# Patient Record
Sex: Female | Born: 1965 | Race: White | Hispanic: No | Marital: Married | Smoking: Never smoker
Health system: Southern US, Community
[De-identification: ages and names within clinical notes are randomized; demographics above are authoritative.]

## PROBLEM LIST (undated history)

## (undated) DIAGNOSIS — L409 Psoriasis, unspecified: Secondary | ICD-10-CM

## (undated) DIAGNOSIS — G43829 Menstrual migraine, not intractable, without status migrainosus: Secondary | ICD-10-CM

## (undated) DIAGNOSIS — T7840XA Allergy, unspecified, initial encounter: Secondary | ICD-10-CM

## (undated) DIAGNOSIS — Z975 Presence of (intrauterine) contraceptive device: Secondary | ICD-10-CM

## (undated) DIAGNOSIS — K219 Gastro-esophageal reflux disease without esophagitis: Secondary | ICD-10-CM

## (undated) HISTORY — DX: Allergy, unspecified, initial encounter: T78.40XA

## (undated) HISTORY — PX: NASAL SEPTUM SURGERY: SHX37

## (undated) HISTORY — DX: Menstrual migraine, not intractable, without status migrainosus: G43.829

## (undated) HISTORY — DX: Presence of (intrauterine) contraceptive device: Z97.5

## (undated) HISTORY — DX: Gastro-esophageal reflux disease without esophagitis: K21.9

## (undated) HISTORY — DX: Psoriasis, unspecified: L40.9

---

## 2009-10-19 LAB — HM COLONOSCOPY: HM Colonoscopy: 10

## 2011-11-18 LAB — HM PAP SMEAR

## 2011-11-23 ENCOUNTER — Ambulatory Visit (INDEPENDENT_AMBULATORY_CARE_PROVIDER_SITE_OTHER): Payer: BC Managed Care – PPO | Admitting: Family Medicine

## 2011-11-23 ENCOUNTER — Encounter: Payer: Self-pay | Admitting: Family Medicine

## 2011-11-23 DIAGNOSIS — G43829 Menstrual migraine, not intractable, without status migrainosus: Secondary | ICD-10-CM

## 2011-11-23 DIAGNOSIS — J309 Allergic rhinitis, unspecified: Secondary | ICD-10-CM

## 2011-11-23 DIAGNOSIS — J4530 Mild persistent asthma, uncomplicated: Secondary | ICD-10-CM

## 2011-11-23 DIAGNOSIS — J45909 Unspecified asthma, uncomplicated: Secondary | ICD-10-CM

## 2011-11-23 DIAGNOSIS — J069 Acute upper respiratory infection, unspecified: Secondary | ICD-10-CM

## 2011-11-23 MED ORDER — FLUTICASONE-SALMETEROL 100-50 MCG/DOSE IN AEPB: 1.0000 | INHALATION_SPRAY | Freq: Two times a day (BID) | RESPIRATORY_TRACT | Status: AC

## 2011-11-23 MED ORDER — ALBUTEROL SULFATE HFA 108 (90 BASE) MCG/ACT IN AERS: 2.0000 | INHALATION_SPRAY | Freq: Four times a day (QID) | RESPIRATORY_TRACT | Status: DC | PRN

## 2011-11-23 NOTE — Patient Instructions (Signed)
Start back on the nasonex and astepro.  Use the advair twice a day, rinse after use, and use the albuterol as needed.  Let me know if you aren't improving.   When you are feeling better, I would look into getting a TDAP.   Take care.  Glad to see you.

## 2011-11-24 ENCOUNTER — Encounter: Payer: Self-pay | Admitting: Family Medicine

## 2011-11-24 DIAGNOSIS — J309 Allergic rhinitis, unspecified: Secondary | ICD-10-CM | POA: Insufficient documentation

## 2011-11-24 DIAGNOSIS — G43829 Menstrual migraine, not intractable, without status migrainosus: Secondary | ICD-10-CM | POA: Insufficient documentation

## 2011-11-24 DIAGNOSIS — J069 Acute upper respiratory infection, unspecified: Secondary | ICD-10-CM | POA: Insufficient documentation

## 2011-11-24 DIAGNOSIS — J4530 Mild persistent asthma, uncomplicated: Secondary | ICD-10-CM | POA: Insufficient documentation

## 2011-11-24 NOTE — Assessment & Plan Note (Signed)
Off usual meds, will restart.  This may be related to home allergen source and this is being evaluated.  

## 2011-11-24 NOTE — Assessment & Plan Note (Signed)
Will d/w OB clinic.

## 2011-11-24 NOTE — Progress Notes (Signed)
New patient to est care.  Requesting records.   ASTHMA Symptoms are well controlled: not after move into new house, possible new environmental trigger Using medications without problems: off meds Night time symptoms: occ Wheeze/SOB:yes ER visits since last visit:no Missed work or school:no Allergens:  See above   Migraine, menstrual, no aura.  Better when breastfeeding and no menses.  Pt to check with OB clinic about options, ie IUD.  D/w pt today.   duration of symptoms: several days, recently on abx for prev episode Rhinorrhea:yes Congestion:yes ear pain:no sore throat: occ Cough: occ myalgias:no  ROS: See HPI.  Otherwise negative.    Meds, vitals, and allergies reviewed.   GEN: nad, alert and oriented HEENT: mucous membranes moist, TM w/o erythema, nasal epithelium injected, OP with cobblestoning NECK: supple w/o LA CV: rrr. PULM: ctab, no inc wob ABD: soft, +bs EXT: no edema

## 2011-11-24 NOTE — Assessment & Plan Note (Signed)
Likely viral as child is in daycare.  Nontoxic, supportive tx.

## 2011-11-24 NOTE — Assessment & Plan Note (Signed)
Off usual meds, will restart.  This may be related to home allergen source and this is being evaluated.

## 2011-12-15 ENCOUNTER — Ambulatory Visit (INDEPENDENT_AMBULATORY_CARE_PROVIDER_SITE_OTHER): Payer: BC Managed Care – PPO | Admitting: Family Medicine

## 2011-12-15 ENCOUNTER — Encounter: Payer: Self-pay | Admitting: Family Medicine

## 2011-12-15 ENCOUNTER — Telehealth: Payer: Self-pay | Admitting: Family Medicine

## 2011-12-15 VITALS — BP 120/78 | HR 76 | Temp 98.3°F | Ht 60.0 in | Wt 122.8 lb

## 2011-12-15 DIAGNOSIS — D229 Melanocytic nevi, unspecified: Secondary | ICD-10-CM

## 2011-12-15 DIAGNOSIS — N390 Urinary tract infection, site not specified: Secondary | ICD-10-CM | POA: Insufficient documentation

## 2011-12-15 DIAGNOSIS — R350 Frequency of micturition: Secondary | ICD-10-CM

## 2011-12-15 DIAGNOSIS — D239 Other benign neoplasm of skin, unspecified: Secondary | ICD-10-CM

## 2011-12-15 LAB — POCT URINALYSIS DIPSTICK
Glucose, UA: NEGATIVE
Nitrite, UA: NEGATIVE
Urobilinogen, UA: 0.2

## 2011-12-15 LAB — POCT UA - MICROSCOPIC ONLY

## 2011-12-15 MED ORDER — CEPHALEXIN 250 MG PO CAPS: 250.0000 mg | ORAL_CAPSULE | Freq: Two times a day (BID) | ORAL | Status: AC

## 2011-12-15 NOTE — Progress Notes (Signed)
Subjective:    Patient ID: Latoya Berry, female    DOB: February 19, 1966, 46 y.o.   MRN: [id number]  HPI Thinks she has a uti and also 2 spots on her anterior chest -- seem wart like   Have been there for 10 days  Has never had anything like that before  They are on top of moles that were there before  They do feel a bit irritated   Urine symptoms started on Monday  Low back pain - more on the R than the left Drinks one soda per day  Drank lemonade one day - and it made it worse  No hx of kidney stone  Is having mild burning when she urinates  Feels like she has to go all the time - and some urgency  No blood in urine that she can see No fever or n/v   Patient Active Problem List  Diagnoses  . Migraine, menstrual  . URI (upper respiratory infection)  . Asthma, mild persistent  . Allergic rhinitis  . UTI (lower urinary tract infection)  . Multiple nevi   Past Medical History  Diagnosis Date  . Asthma   . Menstrual migraine   . Psoriasis   . Allergy   . GERD (gastroesophageal reflux disease)    Past Surgical History  Procedure Date  . Nasal septum surgery    History  Substance Use Topics  . Smoking status: Never Smoker   . Smokeless tobacco: Never Used  . Alcohol Use: No   Family History  Problem Relation Age of Onset  . Hypertension Father    Allergies  Allergen Reactions  . Erythromycin Diarrhea  . Triptans Other (See Comments)    Out of body experience, can't tolerated maxalt **can tolerate imitrex, frova, relpax   Current Outpatient Prescriptions on File Prior to Visit  Medication Sig Dispense Refill  . Prenatal Vit-Fe Fumarate-FA (MULTIVITAMIN-PRENATAL) 27-0.8 MG TABS Take 1 tablet by mouth daily.      Marland Kitchen albuterol (PROVENTIL HFA;VENTOLIN HFA) 108 (90 BASE) MCG/ACT inhaler Inhale 2 puffs into the lungs every 6 (six) hours as needed for wheezing.  1 Inhaler  3  . azelastine (ASTELIN) 137 MCG/SPRAY nasal spray Place 1 spray into the nose 2 (two) times  daily. Use in each nostril as directed      . Fluticasone-Salmeterol (ADVAIR) 100-50 MCG/DOSE AEPB Inhale 1 puff into the lungs 2 (two) times daily.  1 each  3  . mometasone (NASONEX) 50 MCG/ACT nasal spray Place 2 sprays into the nose daily.         Review of Systems Review of Systems  Constitutional: Negative for fever, appetite change, fatigue and unexpected weight change.  Eyes: Negative for pain and visual disturbance.  Respiratory: Negative for cough and shortness of breath.   Cardiovascular: Negative for cp or palpitations    Gastrointestinal: Negative for nausea, diarrhea and constipation.  Genitourinary: pos for urgency/ frequency/ mild dysuria and flank pain  Skin: Negative for pallor or rash  pos for lesions on chest Neurological: Negative for weakness, light-headedness, numbness and headaches.  Hematological: Negative for adenopathy. Does not bruise/bleed easily.  Psychiatric/Behavioral: Negative for dysphoric mood. The patient is not nervous/anxious.          Objective:   Physical Exam  Constitutional: She appears well-developed and well-nourished. No distress.  HENT:  Head: Normocephalic and atraumatic.  Mouth/Throat: Oropharynx is clear and moist.  Eyes: Conjunctivae and EOM are normal. Pupils are equal, round, and reactive to light.  No scleral icterus.  Neck: Neck supple.  Cardiovascular: Normal rate, regular rhythm, normal heart sounds and intact distal pulses.   Pulmonary/Chest: Effort normal and breath sounds normal. No respiratory distress. She has no wheezes.  Abdominal: Soft. Bowel sounds are normal. She exhibits no distension. There is tenderness. There is no rebound and no guarding.       Mild suprapubic tenderness without rebound or gaurding   Very mild tenderness in R flank area as well - but not cva   Musculoskeletal: Normal range of motion. She exhibits no edema.  Lymphadenopathy:    She has no cervical adenopathy.  Neurological: She is alert. She has  normal reflexes.  Skin: Skin is warm and dry. No rash noted.       Nevus on chest L of midline- raised and fleshy- with very dry irritated appearance , tan in color 3 mm  Smaller nevus on R anterior chest 1-2 mm that is tag like also with dry texture   Psychiatric: She has a normal mood and affect.          Assessment & Plan:

## 2011-12-15 NOTE — Assessment & Plan Note (Signed)
Pt has 2 raised nevi on chest that are benign appearing but very dry/ irritated  I suspect these have become dry and irritated from friction/clothing - adv her to try her topicort daily for about 2 weeks and then if not improved to alert [place] (in that case would remove them)

## 2011-12-15 NOTE — Assessment & Plan Note (Signed)
Pt having urinary frequency with back pain - and bacteria on ua  Treated with keflex (since she is nursing) and inst to drink lots of water Handout given on uti  Update if not starting to improve in a week or if worsening

## 2011-12-15 NOTE — Telephone Encounter (Signed)
To dr. Algis Downs, i have never seen pt.

## 2011-12-15 NOTE — Telephone Encounter (Signed)
Triage Record Num: [id number] Operator: Chevis Pretty Patient Name: Latoya Berry Call Date & Time: 12/15/2011 10:47:34AM Patient Phone: [phone] PCP: Patient Gender: Female PCP Fax : Patient DOB: 12-Mar-1966 Practice Name: [facility] Reason for Call: Caller: Latoya Berry/Patient; PCP: Juleen China.; CB#: [phone]; ; ; Call regarding Knot On Her Chest; states is a new patient and has been in office x 1. States has two "strange wart type lumps" in chest area above the sternal notch. States she also has urinary symptoms. Both issues have come up in past week. Lumps are white and do not appear infected. Afebrile. States having urinary frequency, urgency and low back pain. Per protocols, advised appt within 4 hours; declines appt within 4 hours, as she is working and cannot make 1215 appt. Appt sched in office 1615 12/15/11. Protocol(s) Used: Skin Lesions Recommended Outcome per Protocol: See Provider within 24 hours Reason for Outcome: Clusters of swollen, tender, red nodules not improved after 7 days of home treatment Care Advice: ~ Wash the affected area 2-3 times a day with a mild soap. If lesion begins to drain, cover with an absorbent dressing and change dressing 2-3 times a day or when soiled. Dispose of old dressings in a plastic bag. Wash hands thoroughly. ~ During pregnancy, call provider if temperature is 100 F (37.7 C) or greater OR any temperature elevation for 3 days even while taking acetaminophen. ~ To help evaluate changing size, mark lesion area with a pen. If marked area increases more than one inch in an hour, call provider immediately. ~ If lesion is draining, do not re-use or share washcloths or towels. Clothing, linens or other items that have had contact with infected area should be washed in very hot water. ~ Do not try to open a lesion with pressure, squeezing or poking with a needle. This may cause or worsen an infection and increase risk of  scarring. ~ ~ SYMPTOM / CONDITION MANAGEMENT ~ INFECTION CONTROL ~ CAUTIONS Analgesic/Antipyretic Advice - Acetaminophen: Consider acetaminophen as directed on label or by pharmacist/provider for pain or fever PRECAUTIONS: - Use if there is no history of liver disease, alcoholism, or intake of three or more alcohol drinks per day - Only if approved by provider during pregnancy or when breastfeeding - During pregnancy, acetaminophen should not be taken more than 3 consecutive days without telling provider - Do not exceed recommended dose or frequency ~ See a provider within 4 hours if area is quickly getting larger or more areas develop, becoming more painful, purulent drainage, new fever or not improving with treatment. ~ Analgesic/Antipyretic Advice - NSAIDs: Consider aspirin, ibuprofen, naproxen or ketoprofen for pain or fever as directed on label or by pharmacist/provider. PRECAUTIONS: - If over 89 years of age, should not take longer than 1 week without consulting provider. EXCEPTIONS: - Should not be used if taking blood thinners or have bleeding problems. - Do not use if have history of sensitivity/allergy to any of these medications; or history of cardiovascular, ulcer, ~ 12/15/2011 10:58:35AM Page 1 of 2 CAN_TriageRpt_V2 [facility] Triage Call Report Patient Name: Latoya Berry continuation page/s kidney, liver disease or diabetes unless approved by provider. - Do not exceed recommended dose or frequency. ~ Apply warm, moist compress to area for 20-30 min., every 2-3 hours., during waking hours. 12/15/2011 10:58:35AM Page 2 of 2 CAN_TriageRpt_V2 [facility] Triage Call Report Triage Record Num: [id number] Operator: Chevis Pretty Patient Name: Latoya Berry Call Date & Time: 12/15/2011 10:47:34AM Patient Phone: [phone] PCP:  Patient Gender: Female PCP Fax : Patient DOB: 10-Sep-1966 Practice Name: [facility] Reason for Call: Caller: Latoya Berry/Patient;  PCP: Juleen China.; CB#: [phone]; ; ; Call regarding Knot On Her Chest; states is a new patient and has been in office x 1. States has two "strange wart type lumps" in chest area above the sternal notch. States she also has urinary symptoms. Both issues have come up in past week. Lumps are white and do not appear infected. Afebrile. States having urinary frequency, urgency and low back pain. Per protocols, advised appt within 4 hours; declines appt within 4 hours, as she is working and cannot make 1215 appt. Appt sched in office 1615 12/15/11. Protocol(s) Used: Urinary Symptoms - Female Recommended Outcome per Protocol: See Provider within 4 hours Reason for Outcome: Urinary tract symptoms AND any flank or low back pain Care Advice: ~ Another adult should drive. ~ Tell provider medical history of renal disease; especially if have only one kidney. ~ Call provider if symptoms worsen, such as increasing pain in low back, pelvis, or side(s); blood in urine; or fever. Increase intake of fluids. Try to drink 8 oz. (.2 liter) every hour when awake, including unsweetened cranberry juice, unless on restricted fluids for other medical reasons. Take sips of fluid or eat ice chips if nauseated or vomiting. ~ ~ Tell your provider if you are taking Warfarin (Coumadin) and drinking cranberry juice or taking cranberry capsules. ~ SYMPTOM / CONDITION MANAGEMENT ~ CAUTIONS ~ List, or take, all current prescription(s), nonprescription or alternative medication(s) to provider for evaluation. Limit carbonated, alcoholic, and caffeinated beverages such as coffee, tea and soda. Avoid nonprescription cold and allergy medications that contain caffeine. Limit intake of tomatoes, fruit juices (except for unsweetened cranberry juice), dairy products, spicy foods, sugar, and artificial sweeteners (aspartame or saccharine). Stop or decrease smoking. Reducing exposure to bladder irritants may help lessen  urgency. ~ Analgesic/Antipyretic Advice - Acetaminophen: Consider acetaminophen as directed on label or by pharmacist/provider for pain or fever PRECAUTIONS: - Use if there is no history of liver disease, alcoholism, or intake of three or more alcohol drinks per day - Only if approved by provider during pregnancy or when breastfeeding - During pregnancy, acetaminophen should not be taken more than 3 consecutive days without telling provider - Do not exceed recommended dose or frequency ~ Systemic Inflammatory Response Syndrome (SIRS): Watch for signs of a generalized, whole body infection. Occurs within days of a localized infection, especially of the urinary, GI, respiratory or nervous systems; or after a traumatic injury or invasive procedure. - Call EMS 911 if symptoms have worsened, such as increasing confusion or unusual drowsiness; cold and clammy skin; no urine output; rapid respiration (>30/min.) or slow respiration (<10/min.); struggling to breathe. - Go to the ED immediately for early symptoms of rapid pulse >90/min. or rapid breathing >20/min. at rest; chills; oral temperature >100.4 F (38 C) or <96.8 F (36 C) when associated with conditions noted. ~ Analgesic/Antipyretic Advice - NSAIDs: Consider aspirin, ibuprofen, naproxen or ketoprofen for pain or fever as directed on label or by pharmacist/provider. ~ 12/15/2011 10:58:37AM Page 1 of 2 CAN_TriageRpt_V2 [facility] Triage Call Report Patient Name: Latoya Berry continuation page/s PRECAUTIONS: - If over 41 years of age, should not take longer than 1 week without consulting provider. EXCEPTIONS: - Should not be used if taking blood thinners or have bleeding problems. - Do not use if have history of sensitivity/allergy to any of these medications; or history of cardiovascular, ulcer,  kidney, liver disease or diabetes unless approved by provider. - Do not exceed recommended dose or frequency. 12/15/2011 10:58:37AM Page 2  of 2 CAN_TriageRpt_V2

## 2011-12-15 NOTE — Telephone Encounter (Signed)
Scheduled with [facility] today.

## 2011-12-15 NOTE — Patient Instructions (Addendum)
Drink lots of water  Take the keflex as directed  For moles on your chest - use your topicort -- daily for about 2 weeks  If they remain problematic - let us know

## 2011-12-18 ENCOUNTER — Encounter: Payer: Self-pay | Admitting: Family Medicine

## 2012-11-08 ENCOUNTER — Emergency Department: Payer: Self-pay | Admitting: Emergency Medicine

## 2012-11-08 ENCOUNTER — Telehealth: Payer: Self-pay

## 2012-11-08 LAB — CBC
MCH: 30.1 pg (ref 26.0–34.0)
MCHC: 34 g/dL (ref 32.0–36.0)
MCV: 89 fL (ref 80–100)
Platelet: 242 10*3/uL (ref 150–440)
RDW: 13.1 % (ref 11.5–14.5)
WBC: 10.5 10*3/uL (ref 3.6–11.0)

## 2012-11-08 LAB — BASIC METABOLIC PANEL
BUN: 14 mg/dL (ref 7–18)
Calcium, Total: 8.8 mg/dL (ref 8.5–10.1)
Co2: 27 mmol/L (ref 21–32)
EGFR (African American): >60
EGFR (Non-African Amer.): >60
Glucose: 83 mg/dL (ref 65–99)
Osmolality: 281 (ref 275–301)
Potassium: 3.5 mmol/L (ref 3.5–5.1)

## 2012-11-08 LAB — CK TOTAL AND CKMB (NOT AT ARMC)
CK, Total: 66 U/L (ref 21–215)
CK-MB: <0.5 ng/mL — ABNORMAL LOW (ref 0.5–3.6)

## 2012-11-08 NOTE — Telephone Encounter (Addendum)
Pt walked in with upper mid and lt dull chest pain or soreness; when takes deep breath pain gets sharp;started last night. No arm or neck pain and no N or V. Pt is not dizzy. Pt does not feel like heart rate is up.pt does not have SOB unless takes deep breath. Pt only takes multivitamin now on no regular meds. No available appt; spoke with Carlena Sax RN lead and Dr Lianne Bushy CMA and pt to go to [facility] for evaluation. Pt does not appear in any distress but offered to call 911. Pt said no she was OK to drive and will go to [facility] on [address] in [place] now. Pt will update with condition and will call to schedule appt with Dr Para March at later time. Pts pain level now is 3.

## 2012-11-08 NOTE — Telephone Encounter (Signed)
Noted  

## 2012-12-11 ENCOUNTER — Encounter: Payer: Self-pay | Admitting: Family Medicine

## 2012-12-11 ENCOUNTER — Ambulatory Visit (INDEPENDENT_AMBULATORY_CARE_PROVIDER_SITE_OTHER): Payer: BC Managed Care – PPO | Admitting: Family Medicine

## 2012-12-11 VITALS — BP 102/70 | HR 72 | Temp 98.0°F | Wt 125.0 lb

## 2012-12-11 DIAGNOSIS — M7710 Lateral epicondylitis, unspecified elbow: Secondary | ICD-10-CM | POA: Insufficient documentation

## 2012-12-11 NOTE — Assessment & Plan Note (Signed)
Anatomy d/w pt.  Would ice, brace and use rehab exercises per handout.  She agrees.  Fu prn.  No need to image. Will defer macule to derm.

## 2012-12-11 NOTE — Patient Instructions (Addendum)
Get a tennis elbow strap, ice your elbow, and work through the exercises.

## 2012-12-11 NOTE — Progress Notes (Signed)
R lateral elbow pain.  Isn't getting better.  Pain with ROM, esp supination.  Going on for about 1 month.  Pain is getting worse.  Was bumped the elbow prev when bathing her daughter.    She has a nevus/hyperpigmented macule on the elbow but I told her I would defer to derm on this.    Meds, vitals, and allergies reviewed.   ROS: See HPI.  Otherwise, noncontributory.  nad ROM wnl at the R shoulder/elbow/wrist but pain with supination at the elbow.  Elbow with normal inspection.  pain with supination at the elbow resolved with compression of the lateral/proximal forearm Distally nv intact

## 2013-07-29 ENCOUNTER — Encounter: Payer: Self-pay | Admitting: Internal Medicine

## 2013-07-29 ENCOUNTER — Ambulatory Visit (INDEPENDENT_AMBULATORY_CARE_PROVIDER_SITE_OTHER): Payer: BC Managed Care – PPO | Admitting: Internal Medicine

## 2013-07-29 VITALS — BP 124/78 | HR 64 | Temp 98.0°F | Wt 122.5 lb

## 2013-07-29 DIAGNOSIS — J069 Acute upper respiratory infection, unspecified: Secondary | ICD-10-CM

## 2013-07-29 MED ORDER — AZITHROMYCIN 250 MG PO TABS: ORAL_TABLET | ORAL | Status: AC

## 2013-07-29 NOTE — Patient Instructions (Signed)

## 2013-07-29 NOTE — Progress Notes (Signed)
HPI  Pt presents to the clinic today with c/o cold symptoms x 5 days. The worst part is the sore throat and dry cough. She does produce thick green sputum. She denies fever, chills or body aches. She has tried Robitussin, Mucinex, cough drops and nothing seems to help. The cough is worse at night. She has not had much sleep in 3 nights. She does have a history of allergies but no asthma. She does have sick contacts.  Review of Systems      Past Medical History  Diagnosis Date  . Asthma   . Menstrual migraine   . Psoriasis   . Allergy   . GERD (gastroesophageal reflux disease)   . IUD (intrauterine device) in place     Family History  Problem Relation Age of Onset  . Hypertension Father     History   Social History  . Marital Status: Married    Spouse Name: N/A    Number of Children: N/A  . Years of Education: N/A   Occupational History  . Not on file.   Social History Main Topics  . Smoking status: Never Smoker   . Smokeless tobacco: Never Used  . Alcohol Use: No  . Drug Use: No  . Sexual Activity: Not on file   Other Topics Concern  . Not on file   Social History Narrative   Teaches spanish   Married 1997   1 daughter, Jaclynn Guarneri    Allergies  Allergen Reactions  . Erythromycin Diarrhea  . Triptans Other (See Comments)    Out of body experience, can't tolerated maxalt **can tolerate imitrex, frova, relpax     Constitutional: Positive headache, fatigue and fever. Denies abrupt weight changes.  HEENT:  Positive sore throat. Denies eye redness, eye pain, pressure behind the eyes, facial pain, nasal congestion, ear pain, ringing in the ears, wax buildup, runny nose or bloody nose. Respiratory: Positive cough. Denies difficulty breathing or shortness of breath.  Cardiovascular: Denies chest pain, chest tightness, palpitations or swelling in the hands or feet.   No other specific complaints in a complete review of systems (except as listed in HPI  above).  Objective:   BP 124/78  Pulse 64  Temp(Src) 98 F (36.7 C) (Oral)  Wt 122 lb 8 oz (55.566 kg)  BMI 23.92 kg/m2  SpO2 99%  LMP 12/03/2012 Wt Readings from Last 3 Encounters:  07/29/13 122 lb 8 oz (55.566 kg)  12/11/12 125 lb (56.7 kg)  12/15/11 122 lb 12 oz (55.679 kg)     General: Appears her stated age, well developed, well nourished in NAD. HEENT: Head: normal shape and size; Eyes: sclera white, no icterus, conjunctiva pink, PERRLA and EOMs intact; Ears: Tm's gray and intact, normal light reflex; Nose: mucosa pink and moist, septum midline; Throat/Mouth: + PND. Teeth present, mucosa erythematous and moist, no exudate noted, no lesions or ulcerations noted.  Neck: Mild cervical lymphadenopathy. Neck supple, trachea midline. No massses, lumps or thyromegaly present.  Cardiovascular: Normal rate and rhythm. S1,S2 noted.  No murmur, rubs or gallops noted. No JVD or BLE edema. No carotid bruits noted. Pulmonary/Chest: Normal effort and positive vesicular breath sounds. No respiratory distress. No wheezes, rales or ronchi noted.      Assessment & Plan:   Upper Respiratory Infection, new onset with additional workup required:  Get some rest and drink plenty of water Do salt water gargles for the sore throat eRx for Azithromax x 5 days   RTC as needed or  if symptoms persist.

## 2013-12-03 ENCOUNTER — Emergency Department: Payer: Self-pay | Admitting: Internal Medicine

## 2013-12-03 ENCOUNTER — Telehealth: Payer: Self-pay | Admitting: Family Medicine

## 2013-12-03 LAB — HEPATIC FUNCTION PANEL A (ARMC)
Albumin: 3.9 g/dL (ref 3.4–5.0)
Alkaline Phosphatase: 73 U/L
BILIRUBIN TOTAL: 0.3 mg/dL (ref 0.2–1.0)
Bilirubin, Direct: <0.1 mg/dL (ref 0.00–0.20)
SGOT(AST): 14 U/L — ABNORMAL LOW (ref 15–37)
SGPT (ALT): 17 U/L (ref 12–78)
TOTAL PROTEIN: 7.4 g/dL (ref 6.4–8.2)

## 2013-12-03 LAB — CBC
HCT: 41.6 % (ref 35.0–47.0)
HGB: 13.9 g/dL (ref 12.0–16.0)
MCH: 29.8 pg (ref 26.0–34.0)
MCHC: 33.4 g/dL (ref 32.0–36.0)
MCV: 89 fL (ref 80–100)
Platelet: 216 10*3/uL (ref 150–440)
RBC: 4.65 10*6/uL (ref 3.80–5.20)
RDW: 12.8 % (ref 11.5–14.5)
WBC: 10 10*3/uL (ref 3.6–11.0)

## 2013-12-03 LAB — BASIC METABOLIC PANEL
Anion Gap: 5 — ABNORMAL LOW (ref 7–16)
BUN: 17 mg/dL (ref 7–18)
CO2: 27 mmol/L (ref 21–32)
Calcium, Total: 8.6 mg/dL (ref 8.5–10.1)
Chloride: 106 mmol/L (ref 98–107)
Creatinine: 0.63 mg/dL (ref 0.60–1.30)
EGFR (Non-African Amer.): >60
Glucose: 83 mg/dL (ref 65–99)
Osmolality: 276 (ref 275–301)
POTASSIUM: 3.6 mmol/L (ref 3.5–5.1)
Sodium: 138 mmol/L (ref 136–145)

## 2013-12-03 LAB — TROPONIN I: Troponin-I: <0.02 ng/mL

## 2013-12-03 NOTE — Telephone Encounter (Signed)
Ebony Hail at the front desk answered a call from [facility] stating that pt had called their triage line with c/o chest pain.  They advised her to call 911 or go to ED per protocol.  She refused and said she was heading to our office to be seen.  When pt arrived at 4:35, she was not in distress.  She said she was having chest pain last night, it subsided this morning and then came back.  She said "I just need an EKG or something".  I informed her that she would need additional testing that we could not provide at our office.  I spoke with Dr. Damita Dunnings who advised if [facility] instructed her to be seen in ED, then that's where she needed to go.  When I went back to speak with pt, she was sitting in a chair playing with her iPad and talking on the phone.  I informed her that we follow the guidelines that CAN gives to pt's and that we would be happy to call an ambulance for her.  She had her 52 year old granddaughter with her and said that she had no one to keep her and that she couldn't get in touch with her husband.  I told her that the hospital staff would watch over her while she was there.  She said she would just wait and left.

## 2013-12-03 NOTE — Telephone Encounter (Signed)
Patient Information:  Caller Name: Lakrista  Phone: [phone]  Patient: Latoya Berry, Latoya Berry  Gender: Female  DOB: [birth year]  Age: 48 Years  PCP: Elsie Stain Brigitte Pulse) [facility])  Pregnant: No  Office Follow Up:  Does the office need to follow up with this patient?: No  Instructions For The Office: N/A  RN Note:  Pt states that she is not going to hospital and enroute to office.  Pt advised that she has a 911 dispo and she should call and be taken to hospital.  Pt refuses and states that she has a 47 y/o with her and she does not want to do that.  Office contacted and made aware that pt was enroute.  Pt states that she is stuck in traffic.  I advised her that the office would not wait on her arrival that she does not have an appt and she should go to ED and she agreed.  At the end of the call pt states that she understand the need to be seen as soon as possiable  Symptoms  Reason For Call & Symptoms: Pt states that she is having chest pain.  Pressure, does not feel right.  Pain started last pm and was better this am.  Nausea is present.  No SHOB.  Diarrhea x 3.  Pt is refusing ED.  She states that she feels like she has eaten something bad and that is why she is feeling this way.  Reviewed Health History In EMR: No  Reviewed Medications In EMR: No  Reviewed Allergies In EMR: No  Reviewed Surgeries / Procedures: No  Date of Onset of Symptoms: 12/03/2013 OB / GYN:  LMP: Unknown  Guideline(s) Used:  Chest Pain  Disposition Per Guideline:   Call EMS 911 Now  Reason For Disposition Reached:   Chest pain lasting longer than 5 minutes and ANY of the following:  Over 21 years old Over 2 years old and at least one cardiac risk factor (i.e., high blood pressure, diabetes, high cholesterol, obesity, smoker or strong family history of heart disease) Pain is crushing, pressure-like, or heavy  Took nitroglycerin and chest pain was not relieved History of heart disease (i.e., angina,  heart attack, bypass surgery, angioplasty, CHF)  Advice Given:  N/A  Patient Refused Recommendation:  Patient Refused Care Advice  pt enroute to office and refuses 911 or ED

## 2013-12-03 NOTE — Telephone Encounter (Signed)
If she was advised ER, then I wouldn't change the plan.

## 2013-12-23 ENCOUNTER — Emergency Department (INDEPENDENT_AMBULATORY_CARE_PROVIDER_SITE_OTHER): Payer: BC Managed Care – PPO

## 2013-12-23 ENCOUNTER — Encounter (HOSPITAL_COMMUNITY): Payer: Self-pay | Admitting: Emergency Medicine

## 2013-12-23 ENCOUNTER — Emergency Department (HOSPITAL_COMMUNITY)
Admission: EM | Admit: 2013-12-23 | Discharge: 2013-12-23 | Disposition: A | Discharge status: Home / Self Care | Payer: BC Managed Care – PPO | Source: Home / Self Care

## 2013-12-23 DIAGNOSIS — J069 Acute upper respiratory infection, unspecified: Secondary | ICD-10-CM

## 2013-12-23 LAB — POCT RAPID STREP A: Streptococcus, Group A Screen (Direct): NEGATIVE

## 2013-12-23 MED ORDER — ANTIPYRINE-BENZOCAINE 5.4-1.4 % OT SOLN: 3.0000 [drp] | OTIC | Status: AC | PRN

## 2013-12-23 MED ORDER — BENZONATATE 100 MG PO CAPS: 100.0000 mg | ORAL_CAPSULE | Freq: Three times a day (TID) | ORAL | Status: AC | PRN

## 2013-12-23 MED ORDER — IPRATROPIUM BROMIDE 0.06 % NA SOLN: 2.0000 | Freq: Four times a day (QID) | NASAL | Status: AC

## 2013-12-23 NOTE — Discharge Instructions (Signed)
Antibiotic Nonuse  Your caregiver felt that the infection or problem was not one that would be helped with an antibiotic. Infections may be caused by viruses or bacteria. Only a caregiver can tell which one of these is the likely cause of an illness. A cold is the most common cause of infection in both adults and children. A cold is a virus. Antibiotic treatment will have no effect on a viral infection. Viruses can lead to many lost days of work caring for sick children and many missed days of school. Children may catch as many as 10 "colds" or "flus" per year during which they can be tearful, cranky, and uncomfortable. The goal of treating a virus is aimed at keeping the ill person comfortable. Antibiotics are medications used to help the body fight bacterial infections. There are relatively few types of bacteria that cause infections but there are hundreds of viruses. While both viruses and bacteria cause infection they are very different types of germs. A viral infection will typically go away by itself within 7 to 10 days. Bacterial infections may spread or get worse without antibiotic treatment. Examples of bacterial infections are:  Sore throats (like strep throat or tonsillitis).  Infection in the lung (pneumonia).  Ear and skin infections. Examples of viral infections are:  Colds or flus.  Most coughs and bronchitis.  Sore throats not caused by Strep.  Runny noses. It is often best not to take an antibiotic when a viral infection is the cause of the problem. Antibiotics can kill off the helpful bacteria that we have inside our body and allow harmful bacteria to start growing. Antibiotics can cause side effects such as allergies, nausea, and diarrhea without helping to improve the symptoms of the viral infection. Additionally, repeated uses of antibiotics can cause bacteria inside of our body to become resistant. That resistance can be passed onto harmful bacterial. The next time you have  an infection it may be harder to treat if antibiotics are used when they are not needed. Not treating with antibiotics allows our own immune system to develop and take care of infections more efficiently. Also, antibiotics will work better for [place] when they are prescribed for bacterial infections. Treatments for a child that is ill may include:  Give extra fluids throughout the day to stay hydrated.  Get plenty of rest.  Only give your child over-the-counter or prescription medicines for pain, discomfort, or fever as directed by your caregiver.  The use of a cool mist humidifier may help stuffy noses.  Cold medications if suggested by your caregiver. Your caregiver may decide to start you on an antibiotic if:  The problem you were seen for today continues for a longer length of time than expected.  You develop a secondary bacterial infection. SEEK MEDICAL CARE IF:  Fever lasts longer than 5 days.  Symptoms continue to get worse after 5 to 7 days or become severe.  Difficulty in breathing develops.  Signs of dehydration develop (poor drinking, rare urinating, dark colored urine).  Changes in behavior or worsening tiredness (listlessness or lethargy). Document Released: 01/01/2002 Document Revised: 01/15/2012 Document Reviewed: 06/30/2009 [facility] Patient Information 2014 [place], [place].  Your chest xray was normal and your strep test was negative. Your exam does not suggest the need for antibiotics. You have been prescribed medication for cough (tessalon), for nasal congestion (atrovent nasal spray) and ear pain (auralgan). Please use these as prescribed and if your symptoms do not improve over the next several days,  please follow up with your doctor.

## 2013-12-23 NOTE — ED Provider Notes (Signed)
CSN: [id number]     Arrival date & time 12/23/13  1748 History   None    No chief complaint on file.    (Consider location/radiation/quality/duration/timing/severity/associated sxs/prior Treatment) HPI Comments: Patient reports she began having symptoms of nasal congestion, hoarseness of voice, sore throat, cough, post nasal drainage on 12/20/2013. Was seen at local [facility] and diagnosed with bilateral serous otitis and prescribed Flonase. States she has been using this medication and feels her symptoms have not improved. Denies fever, chest pain or dyspnea. No GI or GU sx.   The history is provided by the patient.    Past Medical History  Diagnosis Date  . Asthma   . Menstrual migraine   . Psoriasis   . Allergy   . GERD (gastroesophageal reflux disease)   . IUD (intrauterine device) in place    Past Surgical History  Procedure Laterality Date  . Nasal septum surgery     Family History  Problem Relation Age of Onset  . Hypertension Father    History  Substance Use Topics  . Smoking status: Never Smoker   . Smokeless tobacco: Never Used  . Alcohol Use: No   OB History   Grav Para Term Preterm Abortions TAB SAB Ect Mult Living                 Review of Systems  All other systems reviewed and are negative.      Allergies  Erythromycin and Triptans  Home Medications   Current Outpatient Rx  Name  Route  Sig  Dispense  Refill  . albuterol (PROVENTIL HFA;VENTOLIN HFA) 108 (90 BASE) MCG/ACT inhaler   Inhalation   Inhale 2 puffs into the lungs every 6 (six) hours as needed.         Marland Kitchen azelastine (ASTELIN) 137 MCG/SPRAY nasal spray   Nasal   Place 1 spray into the nose 2 (two) times daily. Use in each nostril as directed         . azithromycin (ZITHROMAX) 250 MG tablet      Take 2 tablets today, then 1 tablet daily x 4 days   6 tablet   0   . Fluticasone-Salmeterol (ADVAIR) 100-50 MCG/DOSE AEPB   Inhalation   Inhale 1 puff into the lungs 2 (two)  times daily.   1 each   3   . ibuprofen (ADVIL,MOTRIN) 400 MG tablet   Oral   Take 400 mg by mouth every 6 (six) hours as needed for pain.         . mometasone (NASONEX) 50 MCG/ACT nasal spray   Nasal   Place 2 sprays into the nose daily.         . Prenatal Vit-Fe Fumarate-FA (MULTIVITAMIN-PRENATAL) 27-0.8 MG TABS   Oral   Take 1 tablet by mouth daily.          BP 145/71  Pulse 66  Temp(Src) 98.5 F (36.9 C) (Oral)  Resp 12  SpO2 100%  LMP 12/03/2012 Physical Exam  Vitals reviewed. Constitutional: She is oriented to person, place, and time. She appears well-developed and well-nourished. No distress.  HENT:  Head: Normocephalic and atraumatic.  Right Ear: Hearing, external ear and ear canal normal.  Left Ear: Tympanic membrane, external ear and ear canal normal.  Nose: Nose normal.  Mouth/Throat: Uvula is midline, oropharynx is clear and moist and mucous membranes are normal. No oropharyngeal exudate.  Trace of clear fluid behind each TM. TMs clear and pearly grey but both are  slightly retracted.   Eyes: Conjunctivae are normal. Right eye exhibits no discharge. Left eye exhibits no discharge. No scleral icterus.  Neck: Normal range of motion. Neck supple.  Cardiovascular: Normal rate, regular rhythm and normal heart sounds.   Pulmonary/Chest: Effort normal and breath sounds normal.  Abdominal: Soft. Bowel sounds are normal. She exhibits no distension. There is no tenderness.  Musculoskeletal: Normal range of motion.  Lymphadenopathy:    She has no cervical adenopathy.  Neurological: She is alert and oriented to person, place, and time.  Skin: Skin is warm and dry. No rash noted.  Psychiatric: She has a normal mood and affect. Her behavior is normal.    ED Course  Procedures (including critical care time) Labs Review Labs Reviewed - No data to display Imaging Review No results found.    MDM   Final diagnoses:  None  CXR unremarkable and rapid strep  screen negative.  Exam does not suggest the need for antibiotics. Patient recently began taking flonase nasal spray but remains symptomatic. Will suggest switching to atrovent nasal spray and will also provide Rx for tessalon for cough and auralgan for ear discomfort. Advise follow up with PCP if symptoms do not improve over next 4-5 days.    [place], [place] 12/23/13 2155

## 2013-12-24 ENCOUNTER — Telehealth: Payer: Self-pay | Admitting: Family Medicine

## 2013-12-24 NOTE — ED Provider Notes (Signed)
Medical screening examination/treatment/procedure(s) were performed by non-physician practitioner and as supervising physician I was immediately available for consultation/collaboration.  Philipp Deputy, M.D.  Harden Mo, MD 12/24/13 [phone]

## 2013-12-24 NOTE — Telephone Encounter (Signed)
[facility] Triage Call Report Triage Record Num: [id number] Operator: Janit Pagan Patient Name: Latoya Berry Call Date & Time: 12/22/2013 4:56:56PM Patient Phone: [phone] PCP: Elsie Stain Patient Gender: Female PCP Fax : Patient DOB: [birth year] Practice Name: [facility] Reason for Call: Caller: Skylar/Patient; PCP: Elsie Stain Brigitte Pulse) [facility]); CB#: [phone]; Call regarding Ear pain; Has had cough, scratchy then sore throat, ear pain and fever that started on Thurs 2/12. Bilateral ear pain became severe, sharp and shooting down in ear, ringing in ears. Started drainage down back of throat, then increased pressure in ears with pain radiating into jaw. Fever Fri 2/13 and Sat 2/14 then stopped. Seen in [facility] yesterday 2/15, told the NP would not be able to give any medication other than Flonase and although she needed antibiotic, the NP could not write it, advised her to check with PMD. Triaged in Ear Symptoms Guideline - See Provider within 24 Hours due to constant or intermittnet dull earache, throbbing in ear, fulness, may interfere with sleep or abikoity to carry out normal activities. Plans to be seen at [place]. Protocol(s) Used: Ear: Symptoms Recommended Outcome per Protocol: See Provider within 24 hours Reason for Outcome: Constant or intermittent dull earache, throbbing in ear(s) or feeling of fullness; may interfere with sleep or ability to carry out normal activities Care Advice: A warm washcloth or heating pad set on low to the affected ear may help relieve the discomfort. May apply for 15 to 20 minutes, 3 to 4 times a day. ~ Resting or sleeping with head elevated, such as semi-reclining in a recliner, may help reduce inner ear pressure and discomfort. ~ Keep ear canal as dry as possible. Take a bath instead of showering. Try to keep water out of ear when washing hair by placing cotton balls in ear opening. Avoid  swimming or water sports until okayed by provider. ~ Speak with provider as soon as possible if having: - discharge from ear that does not look like earwax or that has bad odor - increased redness or swelling of external ear - worsening pain - new or worsening dizziness - or unsteadiness. ~ 12/22/2013 5:14:24PM Page 1 of 1 [email or web address]

## 2013-12-26 LAB — CULTURE, GROUP A STREP

## 2020-01-01 ENCOUNTER — Ambulatory Visit: Payer: BC Managed Care – PPO

## 2020-01-03 ENCOUNTER — Ambulatory Visit: Payer: BC Managed Care – PPO | Attending: Internal Medicine

## 2020-01-03 ENCOUNTER — Other Ambulatory Visit: Payer: Self-pay

## 2020-01-03 DIAGNOSIS — Z23 Encounter for immunization: Secondary | ICD-10-CM | POA: Insufficient documentation

## 2020-01-03 NOTE — Progress Notes (Signed)
   Covid-19 Vaccination Clinic  Name:  Latoya Berry    MRN: [id number] DOB: May 18, 1966  01/03/2020  Latoya Berry was observed post Covid-19 immunization for 15 minutes without incidence. She was provided with Vaccine Information Sheet and instruction to access the V-Safe system.   Latoya Berry was instructed to call 911 with any severe reactions post vaccine: Marland Kitchen Difficulty breathing  . Swelling of your face and throat  . A fast heartbeat  . A bad rash all over your body  . Dizziness and weakness    Immunizations Administered    Name Date Dose VIS Date Route   Pfizer COVID-19 Vaccine 01/03/2020 11:35 AM 0.3 mL 10/17/2019 Intramuscular   Manufacturer: [place]   Lot: [id number]   [place]: [id number]

## 2020-01-24 ENCOUNTER — Ambulatory Visit: Payer: BC Managed Care – PPO | Attending: Internal Medicine

## 2020-01-24 DIAGNOSIS — Z23 Encounter for immunization: Secondary | ICD-10-CM

## 2020-01-24 NOTE — Progress Notes (Signed)
   Covid-19 Vaccination Clinic  Name:  Latoya Berry    MRN: [id number] DOB: Jan 31, 1966  01/24/2020  [name] was observed post Covid-19 immunization for 15 minutes without incident. She was provided with Vaccine Information Sheet and instruction to access the V-Safe system.   [name] was instructed to call 911 with any severe reactions post vaccine: Marland Kitchen Difficulty breathing  . Swelling of face and throat  . A fast heartbeat  . A bad rash all over body  . Dizziness and weakness   Immunizations Administered    Name Date Dose VIS Date Route   Pfizer COVID-19 Vaccine 01/24/2020 11:22 AM 0.3 mL 10/17/2019 Intramuscular   Manufacturer: [place]   Lot: [id number]   [place]: [id number]

## 2020-01-28 ENCOUNTER — Ambulatory Visit: Payer: BC Managed Care – PPO

## 2020-02-02 ENCOUNTER — Ambulatory Visit: Payer: BC Managed Care – PPO

## 2021-03-14 ENCOUNTER — Other Ambulatory Visit: Payer: Self-pay

## 2021-03-14 ENCOUNTER — Encounter: Payer: Self-pay | Admitting: Emergency Medicine

## 2021-03-14 DIAGNOSIS — Z20822 Contact with and (suspected) exposure to covid-19: Secondary | ICD-10-CM | POA: Diagnosis not present

## 2021-03-14 DIAGNOSIS — R509 Fever, unspecified: Secondary | ICD-10-CM | POA: Insufficient documentation

## 2021-03-14 DIAGNOSIS — Z7952 Long term (current) use of systemic steroids: Secondary | ICD-10-CM | POA: Insufficient documentation

## 2021-03-14 DIAGNOSIS — R002 Palpitations: Secondary | ICD-10-CM | POA: Diagnosis not present

## 2021-03-14 DIAGNOSIS — J45909 Unspecified asthma, uncomplicated: Secondary | ICD-10-CM | POA: Insufficient documentation

## 2021-03-14 DIAGNOSIS — R42 Dizziness and giddiness: Secondary | ICD-10-CM | POA: Diagnosis not present

## 2021-03-14 DIAGNOSIS — J1089 Influenza due to other identified influenza virus with other manifestations: Secondary | ICD-10-CM | POA: Insufficient documentation

## 2021-03-14 DIAGNOSIS — R112 Nausea with vomiting, unspecified: Secondary | ICD-10-CM | POA: Diagnosis not present

## 2021-03-14 NOTE — ED Triage Notes (Signed)
Patient ambulatory to triage with steady gait, without difficulty or distress noted; pt reports fever 102, congestion, HA, dizziness and heart racing (st recently rx phentermine); tylenol taken at 10pm

## 2021-03-15 ENCOUNTER — Emergency Department: Payer: BC Managed Care – PPO

## 2021-03-15 ENCOUNTER — Encounter: Payer: Self-pay | Admitting: Radiology

## 2021-03-15 ENCOUNTER — Emergency Department
Admission: EM | Admit: 2021-03-15 | Discharge: 2021-03-15 | Disposition: A | Discharge status: Home / Self Care | Payer: BC Managed Care – PPO | Attending: Student in an Organized Health Care Education/Training Program | Admitting: Student in an Organized Health Care Education/Training Program

## 2021-03-15 DIAGNOSIS — J101 Influenza due to other identified influenza virus with other respiratory manifestations: Secondary | ICD-10-CM

## 2021-03-15 LAB — URINALYSIS, COMPLETE (UACMP) WITH MICROSCOPIC
Bacteria, UA: NONE SEEN
Bilirubin Urine: NEGATIVE
Glucose, UA: NEGATIVE mg/dL
Ketones, ur: 5 mg/dL — AB
Leukocytes,Ua: NEGATIVE
Nitrite: NEGATIVE
Protein, ur: NEGATIVE mg/dL
Specific Gravity, Urine: 1.01 (ref 1.005–1.030)
pH: 6 (ref 5.0–8.0)

## 2021-03-15 LAB — CBC WITH DIFFERENTIAL/PLATELET
Abs Immature Granulocytes: 0.04 10*3/uL (ref 0.00–0.07)
Basophils Absolute: 0 10*3/uL (ref 0.0–0.1)
Basophils Relative: 0 %
Eosinophils Absolute: 0 10*3/uL (ref 0.0–0.5)
Eosinophils Relative: 0 %
HCT: 43.3 % (ref 36.0–46.0)
Hemoglobin: 14.6 g/dL (ref 12.0–15.0)
Immature Granulocytes: 0 %
Lymphocytes Relative: 7 %
Lymphs Abs: 1 10*3/uL (ref 0.7–4.0)
MCH: 29.4 pg (ref 26.0–34.0)
MCHC: 33.7 g/dL (ref 30.0–36.0)
MCV: 87.1 fL (ref 80.0–100.0)
Monocytes Absolute: 0.7 10*3/uL (ref 0.1–1.0)
Monocytes Relative: 5 %
Neutro Abs: 11.2 10*3/uL — ABNORMAL HIGH (ref 1.7–7.7)
Neutrophils Relative %: 88 %
Platelets: 215 10*3/uL (ref 150–400)
RBC: 4.97 MIL/uL (ref 3.87–5.11)
RDW: 12.3 % (ref 11.5–15.5)
WBC: 12.9 10*3/uL — ABNORMAL HIGH (ref 4.0–10.5)
nRBC: 0 % (ref 0.0–0.2)

## 2021-03-15 LAB — COMPREHENSIVE METABOLIC PANEL
ALT: 12 U/L (ref 0–44)
AST: 17 U/L (ref 15–41)
Albumin: 4.3 g/dL (ref 3.5–5.0)
Alkaline Phosphatase: 110 U/L (ref 38–126)
Anion gap: 11 (ref 5–15)
BUN: 15 mg/dL (ref 6–20)
CO2: 20 mmol/L — ABNORMAL LOW (ref 22–32)
Calcium: 8.6 mg/dL — ABNORMAL LOW (ref 8.9–10.3)
Chloride: 102 mmol/L (ref 98–111)
Creatinine, Ser: 0.77 mg/dL (ref 0.44–1.00)
GFR, Estimated: >60 mL/min (ref >60)
Glucose, Bld: 118 mg/dL — ABNORMAL HIGH (ref 70–99)
Potassium: 3.4 mmol/L — ABNORMAL LOW (ref 3.5–5.1)
Sodium: 133 mmol/L — ABNORMAL LOW (ref 135–145)
Total Bilirubin: 0.9 mg/dL (ref 0.3–1.2)
Total Protein: 7.5 g/dL (ref 6.5–8.1)

## 2021-03-15 LAB — RESP PANEL BY RT-PCR (FLU A&B, COVID) ARPGX2
Influenza A by PCR: POSITIVE — AB
Influenza B by PCR: NEGATIVE
SARS Coronavirus 2 by RT PCR: NEGATIVE

## 2021-03-15 LAB — TROPONIN I (HIGH SENSITIVITY)
Troponin I (High Sensitivity): 2 ng/L (ref <18)
Troponin I (High Sensitivity): 4 ng/L (ref <18)

## 2021-03-15 MED ORDER — SODIUM CHLORIDE 0.9 % IV BOLUS
1000.0000 mL | Freq: Once | INTRAVENOUS | Status: AC
  Administered 2021-03-15: 1000 mL via INTRAVENOUS

## 2021-03-15 MED ORDER — ONDANSETRON HCL 4 MG/2ML IJ SOLN
4.0000 mg | Freq: Once | INTRAMUSCULAR | Status: AC
  Administered 2021-03-15: 4 mg via INTRAVENOUS
  Filled 2021-03-15: qty 2

## 2021-03-15 MED ORDER — OSELTAMIVIR PHOSPHATE 75 MG PO CAPS: 75.0000 mg | ORAL_CAPSULE | Freq: Two times a day (BID) | ORAL | 0 refills | Status: AC

## 2021-03-15 MED ORDER — ONDANSETRON 4 MG PO TBDP: 4.0000 mg | ORAL_TABLET | Freq: Three times a day (TID) | ORAL | 0 refills | Status: AC | PRN

## 2021-03-15 NOTE — ED Provider Notes (Signed)
[facility] Emergency Department Provider Note    Event Date/Time   First MD Initiated Contact with Patient 03/15/21 0120     (approximate)  I have reviewed the triage vital signs and the nursing notes.   HISTORY  Chief Complaint Dizziness and Fever    HPI Latoya Berry is a 55 y.o. female [place] past medical history presents to the ER for evaluation of dizziness lightheadedness episode of nausea and vomiting.  Was having palpitations earlier this evening.  Has a mild headache.  Had fever this evening.  No known sick contacts.  Denies any abdominal pain at this time.  Was recently started on phentermine to help with weight loss.  She denies any chest pain or discomfort at this time.  No shortness of breath.    Past Medical History:  Diagnosis Date  . Allergy   . Asthma   . GERD (gastroesophageal reflux disease)   . IUD (intrauterine device) in place   . Menstrual migraine   . Psoriasis    Family History  Problem Relation Age of Onset  . Hypertension Father    Past Surgical History:  Procedure Laterality Date  . NASAL SEPTUM SURGERY     Patient Active Problem List   Diagnosis Date Noted  . Tennis elbow 12/11/2012  . Multiple nevi 12/15/2011  . Migraine, menstrual 11/24/2011  . Asthma, mild persistent 11/24/2011  . Allergic rhinitis 11/24/2011      Prior to Admission medications   Medication Sig Start Date End Date Taking? Authorizing Provider  ondansetron (ZOFRAN ODT) 4 MG disintegrating tablet Take 1 tablet (4 mg total) by mouth every 8 (eight) hours as needed for nausea or vomiting. 03/15/21  Yes Willy Eddy, MD  oseltamivir (TAMIFLU) 75 MG capsule Take 1 capsule (75 mg total) by mouth 2 (two) times daily for 5 days. 03/15/21 03/20/21 Yes Willy Eddy, MD  albuterol (PROVENTIL HFA;VENTOLIN HFA) 108 (90 BASE) MCG/ACT inhaler Inhale 2 puffs into the lungs every 6 (six) hours as needed. 11/23/11   Joaquim Nam, MD   antipyrine-benzocaine Lyla Son) otic solution Place 3-4 drops into both ears every 3 (three) hours as needed for ear pain. 12/23/13   Presson, Mathis Fare, PA  azelastine (ASTELIN) 137 MCG/SPRAY nasal spray Place 1 spray into the nose 2 (two) times daily. Use in each nostril as directed    [provider]  azithromycin (ZITHROMAX) 250 MG tablet Take 2 tablets today, then 1 tablet daily x 4 days 07/29/13   Lorre Munroe, NP  benzonatate (TESSALON) 100 MG capsule Take 1 capsule (100 mg total) by mouth 3 (three) times daily as needed for cough. 12/23/13   Presson, Mathis Fare, PA  Fluticasone-Salmeterol (ADVAIR) 100-50 MCG/DOSE AEPB Inhale 1 puff into the lungs 2 (two) times daily. 11/23/11   Joaquim Nam, MD  guaiFENesin (MUCINEX) 600 MG 12 hr tablet Take by mouth 2 (two) times daily.    [provider]  ibuprofen (ADVIL,MOTRIN) 400 MG tablet Take 400 mg by mouth every 6 (six) hours as needed for pain.    [provider]  ipratropium (ATROVENT) 0.06 % nasal spray Place 2 sprays into both nostrils 4 (four) times daily. 12/23/13   Presson, Mathis Fare, PA  mometasone (NASONEX) 50 MCG/ACT nasal spray Place 2 sprays into the nose daily.    [provider]  omeprazole (PRILOSEC) 20 MG capsule Take 20 mg by mouth 2 (two) times daily before a meal.  [provider]  Prenatal Vit-Fe Fumarate-FA (MULTIVITAMIN-PRENATAL) 27-0.8 MG TABS Take 1 tablet by mouth daily.    [provider]    Allergies Erythromycin and Triptans    Social History Social History   Tobacco Use  . Smoking status: Never Smoker  . Smokeless tobacco: Never Used  Vaping Use  . Vaping Use: Never used  Substance Use Topics  . Alcohol use: No  . Drug use: No    Review of Systems Patient denies headaches, rhinorrhea, blurry vision, numbness, shortness of breath, chest pain, edema, cough, abdominal pain, nausea, vomiting, diarrhea, dysuria, fevers, rashes or  hallucinations unless otherwise stated above in HPI. ____________________________________________   PHYSICAL EXAM:  VITAL SIGNS: Vitals:   03/15/21 0200 03/15/21 0230  BP: 117/61 122/79  Pulse: 77 81  Resp: (!) 26 (!) 26  Temp:    SpO2: 97% 98%    Constitutional: Alert and oriented.  Eyes: Conjunctivae are normal.  Head: Atraumatic. Nose: No congestion/rhinnorhea. Mouth/Throat: Mucous membranes are moist.   Neck: No stridor. Painless ROM.  Cardiovascular: Normal rate, regular rhythm. Grossly normal heart sounds.  Good peripheral circulation. Respiratory: Normal respiratory effort.  No retractions. Lungs CTAB. Gastrointestinal: Soft and nontender. No distention. No abdominal bruits. No CVA tenderness. Genitourinary:  Musculoskeletal: No lower extremity tenderness nor edema.  No joint effusions. Neurologic:  Normal speech and language. No gross focal neurologic deficits are appreciated. No facial droop Skin:  Skin is warm, dry and intact. No rash noted. Psychiatric: Mood and affect are normal. Speech and behavior are normal.  ____________________________________________   LABS (all labs ordered are listed, but only abnormal results are displayed)  Results for orders placed or performed during the hospital encounter of 03/15/21 (from the past 24 hour(s))  Urinalysis, Complete w Microscopic     Status: Abnormal   Collection Time: 03/14/21 11:55 PM  Result Value Ref Range   Color, Urine YELLOW (A) YELLOW   APPearance HAZY (A) CLEAR   Specific Gravity, Urine 1.010 1.005 - 1.030   pH 6.0 5.0 - 8.0   Glucose, UA NEGATIVE NEGATIVE mg/dL   Hgb urine dipstick SMALL (A) NEGATIVE   Bilirubin Urine NEGATIVE NEGATIVE   Ketones, ur 5 (A) NEGATIVE mg/dL   Protein, ur NEGATIVE NEGATIVE mg/dL   Nitrite NEGATIVE NEGATIVE   Leukocytes,Ua NEGATIVE NEGATIVE   RBC / HPF 0-5 0 - 5 RBC/hpf   WBC, UA 0-5 0 - 5 WBC/hpf   Bacteria, UA NONE SEEN NONE SEEN   Squamous Epithelial / LPF 0-5 0 - 5    Mucus PRESENT   CBC with Differential     Status: Abnormal   Collection Time: 03/15/21 12:09 AM  Result Value Ref Range   WBC 12.9 (H) 4.0 - 10.5 K/uL   RBC 4.97 3.87 - 5.11 MIL/uL   Hemoglobin 14.6 12.0 - 15.0 g/dL   HCT 43.3 36.0 - 46.0 %   MCV 87.1 80.0 - 100.0 fL   MCH 29.4 26.0 - 34.0 pg   MCHC 33.7 30.0 - 36.0 g/dL   RDW 12.3 11.5 - 15.5 %   Platelets 215 150 - 400 K/uL   nRBC 0.0 0.0 - 0.2 %   Neutrophils Relative % 88 %   Neutro Abs 11.2 (H) 1.7 - 7.7 K/uL   Lymphocytes Relative 7 %   Lymphs Abs 1.0 0.7 - 4.0 K/uL   Monocytes Relative 5 %   Monocytes Absolute 0.7 0.1 - 1.0 K/uL   Eosinophils Relative 0 %   Eosinophils  Absolute 0.0 0.0 - 0.5 K/uL   Basophils Relative 0 %   Basophils Absolute 0.0 0.0 - 0.1 K/uL   Immature Granulocytes 0 %   Abs Immature Granulocytes 0.04 0.00 - 0.07 K/uL  Comprehensive metabolic panel     Status: Abnormal   Collection Time: 03/15/21 12:09 AM  Result Value Ref Range   Sodium 133 (L) 135 - 145 mmol/L   Potassium 3.4 (L) 3.5 - 5.1 mmol/L   Chloride 102 98 - 111 mmol/L   CO2 20 (L) 22 - 32 mmol/L   Glucose, Bld 118 (H) 70 - 99 mg/dL   BUN 15 6 - 20 mg/dL   Creatinine, Ser 0.77 0.44 - 1.00 mg/dL   Calcium 8.6 (L) 8.9 - 10.3 mg/dL   Total Protein 7.5 6.5 - 8.1 g/dL   Albumin 4.3 3.5 - 5.0 g/dL   AST 17 15 - 41 U/L   ALT 12 0 - 44 U/L   Alkaline Phosphatase 110 38 - 126 U/L   Total Bilirubin 0.9 0.3 - 1.2 mg/dL   GFR, Estimated >60 >60 mL/min   Anion gap 11 5 - 15  Troponin I (High Sensitivity)     Status: None   Collection Time: 03/15/21 12:09 AM  Result Value Ref Range   Troponin I (High Sensitivity) 4 <18 ng/L  Resp Panel by RT-PCR (Flu A&B, Covid) Nasopharyngeal Swab     Status: Abnormal   Collection Time: 03/15/21  1:38 AM   Specimen: Nasopharyngeal Swab; Nasopharyngeal(NP) swabs in vial transport medium  Result Value Ref Range   SARS Coronavirus 2 by RT PCR NEGATIVE NEGATIVE   Influenza A by PCR POSITIVE (A) NEGATIVE    Influenza B by PCR NEGATIVE NEGATIVE   ____________________________________________  EKG My review and personal interpretation at Time: 0:02   Indication: palpitations  Rate: 100  Rhythm: sinus Axis: normal Other: normal intervals, no stemi ____________________________________________  RADIOLOGY  I personally reviewed all radiographic images ordered to evaluate for the above acute complaints and reviewed radiology reports and findings.  These findings were personally discussed with the patient.  Please see medical record for radiology report.  ____________________________________________   PROCEDURES  Procedure(s) performed:  Procedures    Critical Care performed: no ____________________________________________   INITIAL IMPRESSION / ASSESSMENT AND PLAN / ED COURSE  Pertinent labs & imaging results that were available during my care of the patient were reviewed by me and considered in my medical decision making (see chart for details).   DDX: Dehydration, electrolyte [place], viral illness, COVID, flu, pneumonia, UTI  Arnita Susannah Carbin is a 55 y.o. who presents to the ED with presentation as described above.  Patient nontoxic-appearing no acute distress.  Does feel lightheaded will give antiemetic will give IV fluids.  Blood work sent for the blood differential.  EKG without any signs of acute ischemia no signs of preexcitation syndrome.  Clinical Course as of 03/15/21 0328  Tue Mar 15, 2021  0320 Patient flu a positive.  This is consistent with her symptoms.  Feels improved after IV antiemetic as well as IV fluids.  Does appear stable and appropriate for outpatient follow-up.  Discussed return precautions. [PR]    Clinical Course User Index [PR] Merlyn Lot, MD    The patient was evaluated in Emergency Department today for the symptoms described in the history of present illness. He/she was evaluated in the context of the global COVID-19 pandemic, which  necessitated consideration that the patient might be at risk for infection  with the SARS-CoV-2 virus that causes COVID-19. Institutional protocols and algorithms that pertain to the evaluation of patients at risk for COVID-19 are in a state of rapid change based on information released by regulatory bodies including the CDC and federal and state organizations. These policies and algorithms were followed during the patient's care in the ED.  As part of my medical decision making, I reviewed the following data within the [place] notes reviewed and incorporated, Labs reviewed, notes from prior ED visits and [place] Controlled Substance Database   ____________________________________________   FINAL CLINICAL IMPRESSION(S) / ED DIAGNOSES  Final diagnoses:  Influenza A      NEW MEDICATIONS STARTED DURING THIS VISIT:  New Prescriptions   ONDANSETRON (ZOFRAN ODT) 4 MG DISINTEGRATING TABLET    Take 1 tablet (4 mg total) by mouth every 8 (eight) hours as needed for nausea or vomiting.   OSELTAMIVIR (TAMIFLU) 75 MG CAPSULE    Take 1 capsule (75 mg total) by mouth 2 (two) times daily for 5 days.     Note:  This document was prepared using Dragon voice recognition software and may include unintentional dictation errors.    Merlyn Lot, MD 03/15/21 [phone]

## 2022-07-10 IMAGING — DX DG CHEST 1V PORT
1 series · 1 of 1 positions shown · non-contrast
Comparison: Chest x-ray 12/23/2013

CLINICAL DATA: Nausea and vomiting, congestion, headache,
tachycardia

EXAM:
PORTABLE CHEST 1 VIEW

[chest ap]
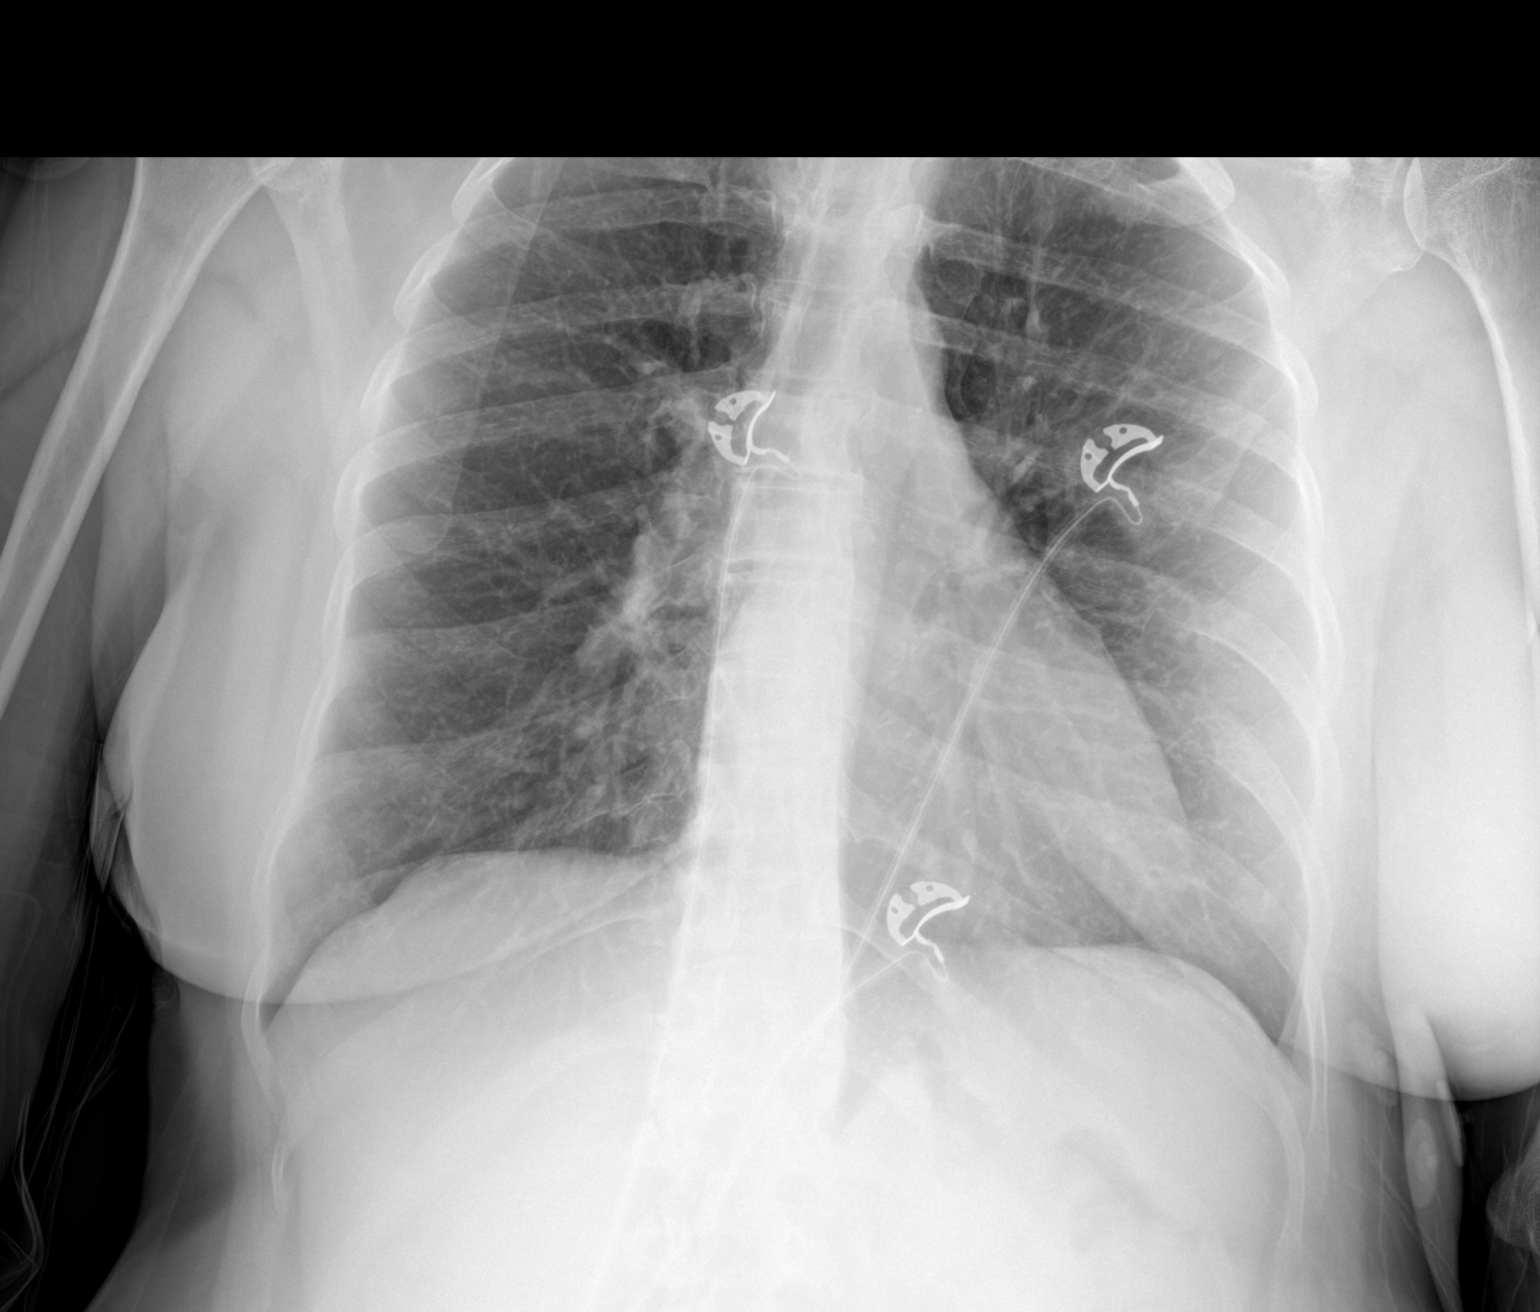

[1 of 1 positions shown; findings below may reference images not displayed]

FINDINGS: The heart size and mediastinal contours are within normal limits.

No focal consolidation. No pulmonary edema. No pleural effusion. No
pneumothorax.

No acute osseous abnormality. Calcific tendinitis of the right
shoulder.
IMPRESSION: No active disease.

## 2023-09-25 NOTE — Progress Notes (Unsigned)
REFERRING PROVIDER: Schermerhorn, Festus Holts, MD [address] [place],  [place] [address]  PRIMARY PROVIDER:  Etta Quill, PA-C  PRIMARY REASON FOR VISIT:  1. Family history of breast cancer      HISTORY OF PRESENT ILLNESS:   Latoya Berry, a 57 y.o. female, was seen for a [place] cancer genetics consultation at the request of Dr. Feliberto Gottron due to a family history of breast cancer.  Latoya Berry presents to clinic today to discuss the possibility of a hereditary predisposition to cancer, genetic testing, and to further clarify her future cancer risks, as well as potential cancer risks for family members.    CANCER HISTORY:  Latoya Berry is a 57 y.o. female with no personal history of cancer.    RISK FACTORS:  Menarche was at age 32.  First live birth at age 57.  Ovaries intact: yes.  Hysterectomy: yes.  Menopausal status: postmenopausal.  Colonoscopy: yes;  few polyps removed . Mammogram within the last year: yes. Number of breast biopsies: 0. Up to date with pelvic exams: yes.   Past Medical History:  Diagnosis Date   Allergy    Asthma    GERD (gastroesophageal reflux disease)    IUD (intrauterine device) in place    Menstrual migraine    Psoriasis     Past Surgical History:  Procedure Laterality Date   NASAL SEPTUM SURGERY      FAMILY HISTORY:  We obtained a detailed, 4-generation family history.  Significant diagnoses are listed below: Family History  Problem Relation Age of Onset   Ovarian cancer Mother 75   Hypertension Father    Breast cancer Maternal Aunt        dx 30s   Breast cancer Paternal Aunt 66   Breast cancer Maternal Grandmother        dx 39s   Lung cancer Maternal Grandfather 3   Breast cancer Other        mat great aunts x4   Latoya Berry has 1 daughter, 7 and 1 brother, 28, no cancers.  Latoya Berry mother had ovarian cancer at 53 and full hysterectomy at 2, she is living at 42. Maternal aunt had breast cancer in  her 30s and passed at 23. Maternal grandmother had breast cancer in her 103s and passed in her 53s. All 4 of her maternal grandmother's sisters (patient's mat great aunts) had breast cancer around the same time, 50s-60s. Patient's maternal grandfather passed of lung cancer, history of smoking, at 73.   Latoya Berry father passed at 47. Paternal aunt had breast cancer in her late 60s-early 59s and is living at 89. No other known cancers on this side of the family.  [name] is unaware of previous family history of genetic testing for hereditary cancer risks. There is no reported Ashkenazi Jewish ancestry. There is no known consanguinity.    GENETIC COUNSELING ASSESSMENT: Latoya Berry is a 57 y.o. female with a family history of breast cancer which is somewhat suggestive of a hereditary cancer syndrome and predisposition to cancer. We, therefore, discussed and recommended the following at today's visit.   DISCUSSION: We discussed that approximately 10% of breast cancer is hereditary. Most cases of hereditary breast cancer are associated with BRCA1/BRCA2 genes, although there are other genes associated with hereditary cancer as well. Cancers and risks are gene specific. We discussed that testing is beneficial for several reasons including knowing about cancer risks, identifying potential screening and risk-reduction options that may be appropriate, and to  understand if other family members could be at risk for cancer and allow them to undergo genetic testing.   We reviewed the characteristics, features and inheritance patterns of hereditary cancer syndromes. We also discussed genetic testing, including the appropriate family members to test, the process of testing, insurance coverage and turn-around-time for results. Her mother is not available for testing. We discussed the implications of a negative, positive and/or variant of uncertain significant result. We recommended Latoya Berry pursue  genetic testing for the [facility] CancerNext-Expanded+RNA gene panel.   Based on Latoya Berry family history of cancer, she meets medical criteria for genetic testing. Despite that she meets criteria, she may still have an out of pocket cost.   PLAN: After considering the risks, benefits, and limitations, Latoya Berry provided informed consent to pursue genetic testing and the blood sample was sent to [facility] for analysis of the CancerNext-Expanded+RNA panel. Results should be available within approximately 2-3 weeks' time, at which point they will be disclosed by telephone to Latoya Berry, as will any additional recommendations warranted by these results. Latoya Berry will receive a summary of her genetic counseling visit and a copy of her results once available. This information will also be available in Epic.   Latoya Berry questions were answered to her satisfaction today. Our contact information was provided should additional questions or concerns arise. Thank you for the referral and allowing [place] to share in the care of your patient.   Lacy Duverney, MS, [facility] Genetic Counselor [email or web address] Phone: [phone]  The patient was seen for a total of 25 minutes in face-to-face genetic counseling.  Dr. Blake Divine was available for discussion regarding this case.   _______________________________________________________________________ For Office Staff:  Number of people involved in session: 1 Was an Intern/ student involved with case: no

## 2023-09-26 ENCOUNTER — Inpatient Hospital Stay: Payer: BC Managed Care – PPO

## 2023-09-26 ENCOUNTER — Inpatient Hospital Stay: Payer: BC Managed Care – PPO | Attending: Oncology | Admitting: Licensed Clinical Social Worker

## 2023-09-26 ENCOUNTER — Encounter: Payer: Self-pay | Admitting: Licensed Clinical Social Worker

## 2023-09-26 DIAGNOSIS — Z803 Family history of malignant neoplasm of breast: Secondary | ICD-10-CM

## 2023-10-22 ENCOUNTER — Ambulatory Visit: Payer: Self-pay | Admitting: Licensed Clinical Social Worker

## 2023-10-22 ENCOUNTER — Encounter: Payer: Self-pay | Admitting: Licensed Clinical Social Worker

## 2023-10-22 ENCOUNTER — Telehealth: Payer: Self-pay | Admitting: Licensed Clinical Social Worker

## 2023-10-22 DIAGNOSIS — Z1379 Encounter for other screening for genetic and chromosomal anomalies: Secondary | ICD-10-CM | POA: Insufficient documentation

## 2023-10-22 NOTE — Telephone Encounter (Signed)
I contacted Ms. Hiramoto to discuss her genetic testing results. No pathogenic variants were identified in the 76 genes analyzed. Detailed clinic note to follow.   The test report has been scanned into EPIC and is located under the Molecular Pathology section of the Results Review tab.  A portion of the result report is included below for reference.      Lacy Duverney, MS, [facility] Genetic Counselor [email or web address] Phone: [phone]

## 2023-10-22 NOTE — Progress Notes (Signed)
HPI:   Latoya Berry was previously seen in the [facility] due to a family history of cancer and concerns regarding a hereditary predisposition to cancer. Please refer to our prior cancer genetics clinic note for more information regarding our discussion, assessment and recommendations, at the time. Latoya Berry recent genetic test results were disclosed to her, as were recommendations warranted by these results. These results and recommendations are discussed in more detail below.  CANCER HISTORY:  Oncology History   No history exists.    FAMILY HISTORY:  We obtained a detailed, 4-generation family history.  Significant diagnoses are listed below: Family History  Problem Relation Age of Onset   Ovarian cancer Mother 89   Hypertension Father    Breast cancer Maternal Aunt        dx 30s   Breast cancer Paternal Aunt 56   Breast cancer Maternal Grandmother        dx 6s   Lung cancer Maternal Grandfather 109   Breast cancer Other        mat great aunts x4   Latoya Berry has 1 daughter, 41 and 1 brother, 60, no cancers.   Latoya Berry mother had ovarian cancer at 34 and full hysterectomy at 29, she is living at 71. Maternal aunt had breast cancer in her 30s and passed at 84. Maternal grandmother had breast cancer in her 54s and passed in her 62s. All 4 of her maternal grandmother's sisters (patient's mat great aunts) had breast cancer around the same time, 50s-60s. Patient's maternal grandfather passed of lung cancer, history of smoking, at 70.    Latoya Berry father passed at 10. Paternal aunt had breast cancer in her late 60s-early 40s and is living at 4. No other known cancers on this side of the family.   Latoya Berry is unaware of previous family history of genetic testing for hereditary cancer risks. There is no reported Ashkenazi Jewish ancestry. There is no known consanguinity.      GENETIC TEST RESULTS:  The [facility] CancerNext-Expanded+RNA  Panel found no pathogenic mutations.   The CancerNext-Expanded gene panel offered by [facility] and includes sequencing, rearrangement, and RNA analysis for the following 76 genes: AIP, ALK, APC, ATM, AXIN2, BAP1, BARD1, BMPR1A, BRCA1, BRCA2, BRIP1, CDC73, CDH1, CDK4, CDKN1B, CDKN2A, CEBPA, CHEK2, CTNNA1, DDX41, DICER1, ETV6, FH, FLCN, GATA2, LZTR1, MAX, MBD4, MEN1, MET, MLH1, MSH2, MSH3, MSH6, MUTYH, NF1, NF2, NTHL1, PALB2, PHOX2B, PMS2, POT1, PRKAR1A, PTCH1, PTEN, RAD51C, RAD51D, RB1, RET, RUNX1, SDHA, SDHAF2, SDHB, SDHC, SDHD, SMAD4, SMARCA4, SMARCB1, SMARCE1, STK11, SUFU, TMEM127, TP53, TSC1, TSC2, VHL, and WT1 (sequencing and deletion/duplication); EGFR, HOXB13, KIT, MITF, PDGFRA, POLD1, and POLE (sequencing only); EPCAM and GREM1 (deletion/duplication only).   The test report has been scanned into EPIC and is located under the Molecular Pathology section of the Results Review tab.  A portion of the result report is included below for reference. Genetic testing reported out on 10/19/2023.      Even though a pathogenic variant was not identified, possible explanations for the cancer in the family may include: There may be no hereditary risk for cancer in the family. The cancers in Latoya Berry and/or her family may be sporadic/familial or due to other genetic and environmental factors. There may be a gene mutation in one of these genes that current testing methods cannot detect but that chance is small. There could be another gene that has not yet been discovered, or that we have not yet tested,  that is responsible for the cancer diagnoses in the family.  It is also possible there is a hereditary cause for the cancer in the family that Latoya Berry did not inherit.  Therefore, it is important to remain in touch with cancer genetics in the future so that we can continue to offer Latoya Berry the most up to date genetic testing.    ADDITIONAL GENETIC TESTING:  We discussed with Ms.  Berry that her genetic testing was fairly extensive.  If there are additional relevant genes identified to increase cancer risk that can be analyzed in the future, we would be happy to discuss and coordinate this testing at that time.    CANCER SCREENING RECOMMENDATIONS:  Latoya Berry test result is considered negative (normal).  This means that we have not identified a hereditary cause for her family history of cancer at this time.   An individual's cancer risk and medical management are not determined by genetic test results alone. Overall cancer risk assessment incorporates additional factors, including personal medical history, family history, and any available genetic information that may result in a personalized plan for cancer prevention and surveillance. Therefore, it is recommended she continue to follow the cancer management and screening guidelines provided by her  primary healthcare provider.  Breast Cancer Screening:  The Tyrer-Cuzick model is one of multiple prediction models developed to estimate an individual's lifetime risk of developing breast cancer. The Tyrer-Cuzick model is endorsed by the [employer] (NCCN). This model includes many risk factors such as family history, endogenous estrogen exposure, and benign breast disease. The calculation is highly-dependent on the accuracy of clinical data provided by the patient and can change over time. The Tyrer-Cuzick model may be repeated to reflect new information in her personal or family history in the future.   Latoya Berry'sTyrer-Cuzick risk score is 16%.  This is above the general population risk of 13%, therefore, she is encouraged to continue to be mindful of her family history and be diligent with general population breast screening, including annual mammograms beginning 10 years prior to the youngest diagnosis in her family or by age 48. She is encouraged to contact us regarding any changes to her  personal or family history, as her recommendations for screening would be altered significantly if her lifetime risk is determined to be greater than 20% based on updated information.     RECOMMENDATIONS FOR FAMILY MEMBERS:   Since she did not inherit a identifiable mutation in a cancer predisposition gene included on this panel, her children could not have inherited a known mutation from her in one of these genes. Individuals in this family might be at some increased risk of developing cancer, over the general population risk, due to the family history of cancer.  Individuals in the family should notify their providers of the family history of cancer. We recommend women in this family have a yearly mammogram beginning at age 40, or 21 years younger than the earliest onset of cancer, an annual clinical breast exam, and perform monthly breast self-exams.  Family members should have colonoscopies by at age 33, or earlier, as recommended by their providers. Other members of the family may still carry a pathogenic variant in one of these genes that Ms. Recchia did not inherit. Based on the family history, we recommend her maternal relatives, especially those who have had cancer, have genetic counseling and testing. Ms. Tiemeyer will let us know if we can be of any assistance in coordinating genetic  counseling and/or testing for this family member.    FOLLOW-UP:  Lastly, we discussed with Ms. Dewar that cancer genetics is a rapidly advancing field and it is possible that new genetic tests will be appropriate for her and/or her family members in the future. We encouraged her to remain in contact with cancer genetics on an annual basis so we can update her personal and family histories and let her know of advances in cancer genetics that may benefit this family.   Our contact number was provided. Ms. Lory questions were answered to her satisfaction, and she knows she is welcome to call us at  anytime with additional questions or concerns.    Lacy Duverney, MS, [facility] Genetic Counselor [email or web address] Phone: [phone]

## 2023-10-23 ENCOUNTER — Ambulatory Visit: Payer: Self-pay | Admitting: Physical Therapy

## 2023-10-25 ENCOUNTER — Ambulatory Visit: Payer: BC Managed Care – PPO | Attending: Obstetrics and Gynecology | Admitting: Physical Therapy

## 2023-10-25 DIAGNOSIS — R278 Other lack of coordination: Secondary | ICD-10-CM | POA: Diagnosis present

## 2023-10-25 DIAGNOSIS — R2689 Other abnormalities of gait and mobility: Secondary | ICD-10-CM | POA: Diagnosis present

## 2023-10-25 DIAGNOSIS — M533 Sacrococcygeal disorders, not elsewhere classified: Secondary | ICD-10-CM | POA: Diagnosis present

## 2023-10-25 NOTE — Therapy (Signed)
OUTPATIENT PHYSICAL THERAPY EVALUATION   Patient Name: Latoya Berry MRN: [id number] DOB:03/16/1966, 57 y.o., female Today's Date: 10/25/2023   PT End of Session - 10/25/23 1609     Visit Number 1    Number of Visits 10    Date for PT Re-Evaluation 01/03/24    PT Start Time 1553    PT Stop Time 1635    PT Time Calculation (min) 42 min    Activity Tolerance Patient tolerated treatment well;No increased pain    Behavior During Therapy WFL for tasks assessed/performed             Past Medical History:  Diagnosis Date   Allergy    Asthma    GERD (gastroesophageal reflux disease)    IUD (intrauterine device) in place    Menstrual migraine    Psoriasis    Past Surgical History:  Procedure Laterality Date   NASAL SEPTUM SURGERY     Patient Active Problem List   Diagnosis Date Noted   Genetic testing 10/22/2023   Tennis elbow 12/11/2012   Multiple nevi 12/15/2011   Migraine, menstrual 11/24/2011   Asthma, mild persistent 11/24/2011   Allergic rhinitis 11/24/2011    PCP: Judie Grieve   REFERRING PROVIDER: Schermerhorn   REFERRING DIAG: N81.6 (ICD-10-CM) - Rectocele  Rationale for Evaluation and Treatment Rehabilitation  THERAPY DIAG:  Sacrococcygeal disorders, not elsewhere classified  Other abnormalities of gait and mobility  Other lack of coordination  ONSET DATE:   SUBJECTIVE:                                                                                                                                                                                           SUBJECTIVE STATEMENT: 1) rectocele:  Pt has saw her PCP who told her that surgery is best. Pt saw a urogyn MD who did not think it will require surgery and to leave to be.  Sometimes, she feels a bulge she can not put back. She can not pinpoint what worsens it.  Across 4 days out of 7 days, it worsens to the point it rubs outside the vaginal and it bleeds and hemorrhoids will form. Pt has daily bowel  movements and has to strain about 50% of time.    2) LBP radiating to back of thigh L /  L hip pain by the glut:   4/10 pain  worst when waking up the morning, sleep on the R side and on belly     4) B plantar fasciitis ( R worse than L) :  occurs in the morning, after playing pickleball if she does not wear proper shoes  and not stretch properly.     PERTINENT HISTORY:  Plays Pickleball  Appendectomy , perineal tears with  dtr ( only child) who was suctioned out    PAIN:  Are you having pain? Yes , see above   PRECAUTIONS: None  WEIGHT BEARING RESTRICTIONS: No   FALLS:  Has patient fallen in last 6 months? No  LIVING ENVIRONMENT: Lives with:family  Lives in: two story home  Stairs:   OCCUPATION:  teacher   PLOF:    PATIENT GOALS:  Address rectocele and LBP, feet pain   OBJECTIVE:   [facility] PT Assessment - 10/26/23 1304       Observation/Other Assessments   Observations seated posture with ankles crossed, slumped posture      AROM   Overall AROM Comments R rotation caused R LBP , no reproduction of radiating LBP with 4 directions of the spine      Strength   Overall Strength Comments BLE 4/5      Palpation   Spinal mobility R shoulder, iliac crest lowered      Ambulation/Gait   Gait Comments 1.32 m/s, excessive sway to R             [facility] Adult PT Treatment/Exercise - 10/26/23 1303       Ambulation/Gait   Gait Comments 1.32 m/s, excessive sway to R      Therapeutic Activites    Therapeutic Activities Other Therapeutic Activities    Other Therapeutic Activities explained body mechanic to minimize worsening of prolapse, explained deep core system and physics principles for prolapse , showed anatomy, cocreated goals ,      Neuro Re-ed    Neuro Re-ed Details  cued for body mechanics,                HOME EXERCISE PROGRAM: See pt instruction section    ASSESSMENT:  CLINICAL IMPRESSION:   Pt is a 57   yo  who presents with rectocele Sx,  L LBP/glut pain, B plantar fasciitis which impact QOL, ADL, fitness, and community activities.   Pt's musculoskeletal assessment revealed uneven pelvic girdle and shoulder height, asymmetries to gait pattern, limited spinal /pelvic mobility, dyscoordination and strength of pelvic floor mm, hip weakness, poor body mechanics which places strain on the abdominal/pelvic floor mm. These are deficits that indicate an ineffective intraabdominal pressure system associated with increased risk for pt's Sx. Pt plays pickleball and has scars from  appendectomy ,  L & D of only child involved suctioning and perineal tears.   These are contributing factors.       Pt will benefit from coordination training and education on fitness and functional positions in order to gain a more effective intraabdominal pressure system to minimize worsening of prolapse.  Pt was provided education on etiology of Sx with anatomy, physiology explanation with images along with the benefits of customized pelvic PT Tx based on pt's medical conditions and musculoskeletal deficits.  Explained the physiology of deep core mm coordination and roles of pelvic floor function in urination, defecation, sexual function, and postural control with deep core mm system.   Regional interdependent approaches will yield greater benefits in pt's POC due to the complexity of pt's medical Hx and the significant impact their Sx have had on their QOL. Pt would benefit from a biopsychosocial approach to yield optimal outcomes. Plan to build interdisciplinary team with pt's providers to optimize patient-centered care.   Following Tx today which pt tolerated without complaints,  pt demo'd proper  body mechanics to minimize straining pelvic floor.  Plan to address realignment of spine/ pelvis at next session to help promote optimize IAP system for improved pelvic floor function, trunk stability, gait, balance, stabilization with mobility tasks.  Plan to address pelvic  floor issues once pelvis and spine are realigned to yield better outcomes.    Pt benefits from skilled PT.    OBJECTIVE IMPAIRMENTS decreased activity tolerance, decreased coordination, decreased endurance, decreased mobility, difficulty walking, decreased ROM, decreased strength, decreased safety awareness, hypomobility, increased muscle spasms, impaired flexibility, improper body mechanics, postural dysfunction, and pain. scar restrictions   ACTIVITY LIMITATIONS  self-care,  sleep, home chores, work tasks    PARTICIPATION LIMITATIONS:  community, gym activities    PERSONAL FACTORS        are also affecting patient's functional outcome.    REHAB POTENTIAL: Good   CLINICAL DECISION MAKING: Evolving/moderate complexity   EVALUATION COMPLEXITY: Moderate    PATIENT EDUCATION:    Education details: Showed pt anatomy images. Explained muscles attachments/ connection, physiology of deep core system/ spinal- thoracic-pelvis-lower kinetic chain as they relate to pt's presentation, Sx, and past Hx. Explained what and how these areas of deficits need to be restored to balance and function    See Therapeutic activity / neuromuscular re-education section  Answered pt's questions.   Person educated: Patient Education method: Explanation, Demonstration, Tactile cues, Verbal cues, and Handouts Education comprehension: verbalized understanding, returned demonstration, verbal cues required, tactile cues required, and needs further education     PLAN: PT FREQUENCY: 1x/week   PT DURATION: 10 weeks   PLANNED INTERVENTIONS: Therapeutic exercises, Therapeutic activity, Neuromuscular re-education, Balance training, Gait training, Patient/Family education, Self Care, Joint mobilization, Spinal mobilization, Moist heat, Taping, and Manual therapy, dry needling.   PLAN FOR NEXT SESSION: See clinical impression for plan     GOALS: Goals reviewed with patient? Yes  SHORT TERM GOALS: Target date:  11/22/2023      Pt will demo IND with HEP                    Baseline: Not IND            Goal status: INITIAL   LONG TERM GOALS: Target date: 01/03/2024    1.Pt will demo proper deep core coordination without chest breathing and optimal excursion of diaphragm/pelvic floor in order to promote spinal stability and pelvic floor function  Baseline: dyscoordination Goal status: INITIAL  2.  Pt will demo > 5 pt change on FOTO  to improve QOL and function  PFDI Urinary baseline -  Lower score = better function  Urinary Problem baseline-   Higher score = better function  Pelvic Pain baseline - Lower score = better function  Bowel  constipation baseline -   Higher score = better function  PFDI Bowel - Higher score = better function  Lumber baseline  -  Higher score = better function   Goal status: INITIAL  ( plan to assess next session)   3.  Pt will demo proper body mechanics in against gravity tasks and ADLs  work tasks, fitness  to minimize straining pelvic floor / back    Baseline: not IND, improper form that places strain on pelvic floor  Goal status: INITIAL    4. Pt will demo increased gait speed > 1.3 m/s with reciprocal gait pattern, longer stride length  in order to ambulate safely in community and return to fitness routine  Baseline: 1.32 m/s, excessive sway to  R  Goal status: INITIAL    5. Pt will practice sleeping on back to minimize misalignment of spine and pelvis and minimize LBP/ hip pain , sleeping side with pillow between knees and also report 1 week of waking in the moring with < 50% occurrence of the pain at both areas   Baseline: sleep on the R side and on belly   Goal status: INITIAL   6. Pt will report rectocole occurs outside the vaginal < 3 days out of 7 days and no bleeding in order to improve QOL  Baseline:  Across 4 days out of 7 days, it worsens to the point it rubs outside the vaginal and it bleeds.  Goal status: INITIAL   7. Pt  will report no more straining with bowel movements across 100% of the time  Baseline: Pt has daily bowel movements and has to strain about 50% of time.  Goal status: INITIAL   8.  Pt will demo levelled pelvic girdle and shoulder height in order to progress to deep core strengthening HEP and restore mobility at spine, pelvis, gait, posture minimize falls, and improve balance  Baseline: R shoulder, iliac rest lowered Goal:  INITIAL    Mariane Masters, PT 10/25/2023, 4:19 PM

## 2023-10-26 NOTE — Patient Instructions (Signed)
   Proper body mechanics with getting out of a chair to decrease strain  on back &pelvic floor   Avoid holding your breath when Getting out of the chair:  Scoot to front part of chair chair Heels behind knees, feet are hip width apart, nose over toes  Inhale like you are smelling roses Exhale to stand   __  Sitting with feet on ground, four points of contact Catch yourself crossing ankles and thighs  __

## 2023-10-29 ENCOUNTER — Ambulatory Visit: Payer: BC Managed Care – PPO | Admitting: Physical Therapy

## 2023-10-29 DIAGNOSIS — M533 Sacrococcygeal disorders, not elsewhere classified: Secondary | ICD-10-CM

## 2023-10-29 DIAGNOSIS — R2689 Other abnormalities of gait and mobility: Secondary | ICD-10-CM

## 2023-10-29 DIAGNOSIS — R278 Other lack of coordination: Secondary | ICD-10-CM

## 2023-10-29 NOTE — Patient Instructions (Signed)
Stretch for pelvic floor     On belly: Riding horse edge of mattress  knee bent like riding a horse, move knee towards armpit and out  10 reps    Frog stretch: laying on belly with pillow under hips, knees bent, inhale do nothing, exhale let ankles fall apart  10 reps    Childs pose rocking   Toes tucked, shoulders down and back, on forearms , hands shoulder width apart, fingers straight, elbow back , squeeze imaginary pencils in armpit, shoulder down and away from ears  10 reps

## 2023-10-29 NOTE — Therapy (Addendum)
OUTPATIENT PHYSICAL THERAPY  TREATMENT    Patient Name: Latoya Berry MRN: [id number] DOB:11-18-1965, 57 y.o., female Today's Date: 10/29/2023   PT End of Session - 10/29/23 0942     Visit Number 2    Number of Visits 10    Date for PT Re-Evaluation 01/03/24    PT Start Time 0939    PT Stop Time 1020    PT Time Calculation (min) 41 min    Activity Tolerance Patient tolerated treatment well;No increased pain    Behavior During Therapy WFL for tasks assessed/performed             Past Medical History:  Diagnosis Date   Allergy    Asthma    GERD (gastroesophageal reflux disease)    IUD (intrauterine device) in place    Menstrual migraine    Psoriasis    Past Surgical History:  Procedure Laterality Date   NASAL SEPTUM SURGERY     Patient Active Problem List   Diagnosis Date Noted   Genetic testing 10/22/2023   Tennis elbow 12/11/2012   Multiple nevi 12/15/2011   Migraine, menstrual 11/24/2011   Asthma, mild persistent 11/24/2011   Allergic rhinitis 11/24/2011    PCP: Judie Grieve   REFERRING PROVIDER: Schermerhorn   REFERRING DIAG: N81.6 (ICD-10-CM) - Rectocele  Rationale for Evaluation and Treatment Rehabilitation  THERAPY DIAG:  Sacrococcygeal disorders, not elsewhere classified  Other abnormalities of gait and mobility  Other lack of coordination  ONSET DATE:   SUBJECTIVE:                                                                                                                                                                                           SUBJECTIVE STATEMENT TODAY: Pt has been catching herself holding her breath with sit to stand.   Pt has been practicing exhaling with standing from sitting and not crossing her legs.    SUBJECTIVE STATEMENT ON EVAL 10/25/23  1) rectocele:  Pt has saw her PCP who told her that surgery is best. Pt saw a urogyn MD who did not think it will require surgery and to leave to be.  Sometimes, she feels a  bulge she can not put back. She can not pinpoint what worsens it.  Across 4 days out of 7 days, it worsens to the point it rubs outside the vaginal and it bleeds and hemorrhoids will form. Pt has daily bowel movements and has to strain about 50% of time.    2) LBP radiating to back of thigh L /  L hip pain by the glut:   4/10 pain  worst when waking  up the morning, sleep on the R side and on belly     4) B plantar fasciitis ( R worse than L) :  occurs in the morning, after playing pickleball if she does not wear proper shoes and not stretch properly.     PERTINENT HISTORY:  Plays Pickleball  Appendectomy , perineal tears with  dtr ( only child) who was suctioned out    PAIN:  Are you having pain? Yes , see above   PRECAUTIONS: None  WEIGHT BEARING RESTRICTIONS: No   FALLS:  Has patient fallen in last 6 months? No  LIVING ENVIRONMENT: Lives with:family  Lives in: two story home  Stairs:   OCCUPATION:  teacher   PLOF:    PATIENT GOALS:  Address rectocele and LBP, feet pain   OBJECTIVE:    [facility] PT Assessment - 10/29/23 0948       AROM   Overall AROM Comments R rotation caused pain at tailbone ( no pain post Tx)       Palpation   Spinal mobility levelled shoulder / pelvis             Pelvic Floor Special Questions - 10/29/23 1013     External Perineal Exam external through clothing: flexed coccyx, SIJ  hypomobility, nutation limited,             [facility] Adult PT Treatment/Exercise - 10/29/23 1013       Neuro Re-ed    Neuro Re-ed Details  cued for pelvic floor stretches to promote ext of coccyx, SIJ mobility      Modalities   Modalities Moist Heat      Moist Heat Therapy   Number Minutes Moist Heat 3 Minutes    Moist Heat Location --   in supported childs pose with bolster to promote lenghtenign of pelvic floor and anteiror tilt of pelvis/ ext of coccyx     Manual Therapy   Manual therapy comments STM/MWM , grade PA III at SIJ and superior glide to  promote coccyx ext               HOME EXERCISE PROGRAM: See pt instruction section    ASSESSMENT:  CLINICAL IMPRESSION:    Pt showed good carry over with pelvic and shoulder alignment with compliances to body mechanics.    Manual Tx was applied to promote ext of coccyx and nutation of SIJ.    Pt had no pain at tailbone with R trunk rotation and did not feel her prolapse in childs pose position post Tx.    Cued for pelvic floor stretches which pt showed IND with proper technique.     Plan to progress to deep core training next session to help promote optimize IAP system for improved pelvic floor function, trunk stability, gait, balance, stabilization with mobility tasks.  Plan to address pelvic floor issues once pelvis and spine are realigned to yield better outcomes.    Pt benefits from skilled PT.    OBJECTIVE IMPAIRMENTS decreased activity tolerance, decreased coordination, decreased endurance, decreased mobility, difficulty walking, decreased ROM, decreased strength, decreased safety awareness, hypomobility, increased muscle spasms, impaired flexibility, improper body mechanics, postural dysfunction, and pain. scar restrictions   ACTIVITY LIMITATIONS  self-care,  sleep, home chores, work tasks    PARTICIPATION LIMITATIONS:  community, gym activities    PERSONAL FACTORS        are also affecting patient's functional outcome.    REHAB POTENTIAL: Good   CLINICAL DECISION MAKING: Evolving/moderate complexity  EVALUATION COMPLEXITY: Moderate    PATIENT EDUCATION:    Education details: Showed pt anatomy images. Explained muscles attachments/ connection, physiology of deep core system/ spinal- thoracic-pelvis-lower kinetic chain as they relate to pt's presentation, Sx, and past Hx. Explained what and how these areas of deficits need to be restored to balance and function    See Therapeutic activity / neuromuscular re-education section  Answered pt's questions.    Person educated: Patient Education method: Explanation, Demonstration, Tactile cues, Verbal cues, and Handouts Education comprehension: verbalized understanding, returned demonstration, verbal cues required, tactile cues required, and needs further education     PLAN: PT FREQUENCY: 1x/week   PT DURATION: 10 weeks   PLANNED INTERVENTIONS: Therapeutic exercises, Therapeutic activity, Neuromuscular re-education, Balance training, Gait training, Patient/Family education, Self Care, Joint mobilization, Spinal mobilization, Moist heat, Taping, and Manual therapy, dry needling.   PLAN FOR NEXT SESSION: See clinical impression for plan     GOALS: Goals reviewed with patient? Yes  SHORT TERM GOALS: Target date: 11/22/2023      Pt will demo IND with HEP                    Baseline: Not IND            Goal status: INITIAL   LONG TERM GOALS: Target date: 01/03/2024    1.Pt will demo proper deep core coordination without chest breathing and optimal excursion of diaphragm/pelvic floor in order to promote spinal stability and pelvic floor function  Baseline: dyscoordination Goal status: INITIAL  2.  Pt will demo > 5 pt change on FOTO  to improve QOL and function  PFDI Urinary baseline -  Lower score = better function  Urinary Problem baseline-   Higher score = better function  Pelvic Pain baseline - Lower score = better function  Bowel  constipation baseline -   Higher score = better function  PFDI Bowel - Higher score = better function  Lumber baseline  -  Higher score = better function   Goal status: INITIAL  ( plan to assess next session)   3.  Pt will demo proper body mechanics in against gravity tasks and ADLs  work tasks, fitness  to minimize straining pelvic floor / back    Baseline: not IND, improper form that places strain on pelvic floor  Goal status: INITIAL    4. Pt will demo increased gait speed > 1.3 m/s with reciprocal gait pattern, longer stride  length  in order to ambulate safely in community and return to fitness routine  Baseline: 1.32 m/s, excessive sway to R  Goal status: INITIAL    5. Pt will practice sleeping on back to minimize misalignment of spine and pelvis and minimize LBP/ hip pain , sleeping side with pillow between knees and also report 1 week of waking in the moring with < 50% occurrence of the pain at both areas   Baseline: sleep on the R side and on belly   Goal status: INITIAL   6. Pt will report rectocole occurs outside the vaginal < 3 days out of 7 days and no bleeding in order to improve QOL  Baseline:  Across 4 days out of 7 days, it worsens to the point it rubs outside the vaginal and it bleeds.  Goal status: INITIAL   7. Pt will report no more straining with bowel movements across 100% of the time  Baseline: Pt has daily bowel movements and has to strain about 50% of  time.  Goal status: INITIAL   8.  Pt will demo levelled pelvic girdle and shoulder height in order to progress to deep core strengthening HEP and restore mobility at spine, pelvis, gait, posture minimize falls, and improve balance  Baseline: R shoulder, iliac rest lowered Goal:  INITIAL    Mariane Masters, PT 10/29/2023, 9:43 AM

## 2023-11-08 ENCOUNTER — Ambulatory Visit: Payer: 59 | Attending: Obstetrics and Gynecology | Admitting: Physical Therapy

## 2023-11-08 ENCOUNTER — Ambulatory Visit: Payer: 59 | Admitting: Physical Therapy

## 2023-11-08 DIAGNOSIS — R278 Other lack of coordination: Secondary | ICD-10-CM | POA: Diagnosis present

## 2023-11-08 DIAGNOSIS — M533 Sacrococcygeal disorders, not elsewhere classified: Secondary | ICD-10-CM | POA: Diagnosis present

## 2023-11-08 DIAGNOSIS — R2689 Other abnormalities of gait and mobility: Secondary | ICD-10-CM | POA: Insufficient documentation

## 2023-11-08 NOTE — Patient Instructions (Signed)
   Clam Shell 45 Degrees  Lying with hips and knees bent 45, one pillow between knees and ankles. Heel together, toes apart like ballerina,  Lift knee with exhale while pressing heels together. Be sure pelvis does not roll backward. Do not arch back. Do 20 times, each leg, 2 times per day.    Complimentary stretch:      Sit at 45 deg turn with R leg and knee on edge of chair/ bench, L buttock hanging off the edge to bring the L foot back like a lunge, toes bent, lower heel to feel quad stretch,  pay attention to keeping pinky and first toe ballmound planted to align toes forward so ankles are not twerked   Repeat with other side   ___

## 2023-11-08 NOTE — Therapy (Signed)
 OUTPATIENT PHYSICAL THERAPY  TREATMENT    Patient Name: Latoya Berry MRN: [id number] DOB:September 10, 1966, 58 y.o., female Today's Date: 11/08/2023   PT End of Session - 11/08/23 0940     Visit Number 3    Number of Visits 10    Date for PT Re-Evaluation 01/03/24    PT Start Time 0938    PT Stop Time 1020    PT Time Calculation (min) 42 min    Activity Tolerance Patient tolerated treatment well;No increased pain    Behavior During Therapy WFL for tasks assessed/performed             Past Medical History:  Diagnosis Date   Allergy    Asthma    GERD (gastroesophageal reflux disease)    IUD (intrauterine device) in place    Menstrual migraine    Psoriasis    Past Surgical History:  Procedure Laterality Date   NASAL SEPTUM SURGERY     Patient Active Problem List   Diagnosis Date Noted   Genetic testing 10/22/2023   Tennis elbow 12/11/2012   Multiple nevi 12/15/2011   Migraine, menstrual 11/24/2011   Asthma, mild persistent 11/24/2011   Allergic rhinitis 11/24/2011    PCP: Dorise   REFERRING PROVIDER: Schermerhorn   REFERRING DIAG: N81.6 (ICD-10-CM) - Rectocele  Rationale for Evaluation and Treatment Rehabilitation  THERAPY DIAG:  Sacrococcygeal disorders, not elsewhere classified  Other abnormalities of gait and mobility  Other lack of coordination  ONSET DATE:   SUBJECTIVE:                                                                                                                                                                                           SUBJECTIVE STATEMENT TODAY: Pt has been catching herself holding her breath with sit to stand.   Pt has been practicing exhaling with standing from sitting and not crossing her legs.    SUBJECTIVE STATEMENT ON EVAL 10/25/23  1) rectocele:  Pt has saw her PCP who told her that surgery is best. Pt saw a urogyn MD who did not think it will require surgery and to leave to be.  Sometimes, she feels a  bulge she can not put back. She can not pinpoint what worsens it.  Across 4 days out of 7 days, it worsens to the point it rubs outside the vaginal and it bleeds and hemorrhoids will form. Pt has daily bowel movements and has to strain about 50% of time.    2) LBP radiating to back of thigh L /  L hip pain by the glut:   4/10 pain  worst when waking  up the morning, sleep on the R side and on belly     4) B plantar fasciitis ( R worse than L) :  occurs in the morning, after playing pickleball if she does not wear proper shoes and not stretch properly.     PERTINENT HISTORY:  Plays Pickleball  Appendectomy , perineal tears with  dtr ( only child) who was suctioned out    PAIN:  Are you having pain? Yes , see above   PRECAUTIONS: None  WEIGHT BEARING RESTRICTIONS: No   FALLS:  Has patient fallen in last 6 months? No  LIVING ENVIRONMENT: Lives with:family  Lives in: two story home  Stairs:   OCCUPATION:  teacher   PLOF:    PATIENT GOALS:  Address rectocele and LBP, feet pain   OBJECTIVE:    [facility] PT Assessment - 11/08/23 0941       AROM   Overall AROM Comments no tailbone pain when rotating R compared to pain last week      Palpation   Spinal mobility levelled shoulder / pelvis    Palpation comment L SIJ hypomobile in hip ext/ abd , tendernress along L sacrococcyggeal/ sacrotuberosity ligament L             [facility] Adult PT Treatment/Exercise - 11/08/23 1059       Neuro Re-ed    Neuro Re-ed Details  cued for hip abd ( clams) with propioception of feet to minimize supination and optmize LKC and deep core , cued for stretch after clams to promote hip ext of SIJ, nutation of coccyx, stretch of hip abd mm      Modalities   Modalities Moist Heat      Moist Heat Therapy   Number Minutes Moist Heat 5 Minutes    Moist Heat Location --   sacrum ( unbilled)     Manual Therapy   Manual therapy comments PA mob grade III to promote L SIOJ mobility, rotatinoal mob /  superior glide to promote mobility of L ilia                HOME EXERCISE PROGRAM: See pt instruction section    ASSESSMENT:  CLINICAL IMPRESSION:     Pt showed good carry over from last week w/ no tailbone pain when rotating R compared to pain last week .     Manual Tx was applied today  to promote ext of coccyx and nutation of SIJ on L after which pt showed no more tenderness and gained more SIJ mobility.   Cued for hip abd ( clams) with propioception of feet to minimize supination and optmize LKC and deep core , cued for stretch after clams to promote hip ext of SIJ, nutation of coccyx, stretch of hip   abd mm.  Plan to progress to deep core training next session to help promote optimize IAP system for improved pelvic floor function, trunk stability, gait, balance, stabilization with mobility tasks.  Plan to address pelvic floor issues once pelvis and spine are realigned to yield better outcomes.    Pt benefits from skilled PT.    OBJECTIVE IMPAIRMENTS decreased activity tolerance, decreased coordination, decreased endurance, decreased mobility, difficulty walking, decreased ROM, decreased strength, decreased safety awareness, hypomobility, increased muscle spasms, impaired flexibility, improper body mechanics, postural dysfunction, and pain. scar restrictions   ACTIVITY LIMITATIONS  self-care,  sleep, home chores, work tasks    PARTICIPATION LIMITATIONS:  community, gym activities    PERSONAL FACTORS  are also affecting patient's functional outcome.    REHAB POTENTIAL: Good   CLINICAL DECISION MAKING: Evolving/moderate complexity   EVALUATION COMPLEXITY: Moderate    PATIENT EDUCATION:    Education details: Showed pt anatomy images. Explained muscles attachments/ connection, physiology of deep core system/ spinal- thoracic-pelvis-lower kinetic chain as they relate to pt's presentation, Sx, and past Hx. Explained what and how these areas of deficits need to be  restored to balance and function    See Therapeutic activity / neuromuscular re-education section  Answered pt's questions.   Person educated: Patient Education method: Explanation, Demonstration, Tactile cues, Verbal cues, and Handouts Education comprehension: verbalized understanding, returned demonstration, verbal cues required, tactile cues required, and needs further education     PLAN: PT FREQUENCY: 1x/week   PT DURATION: 10 weeks   PLANNED INTERVENTIONS: Therapeutic exercises, Therapeutic activity, Neuromuscular re-education, Balance training, Gait training, Patient/Family education, Self Care, Joint mobilization, Spinal mobilization, Moist heat, Taping, and Manual therapy, dry needling.   PLAN FOR NEXT SESSION: See clinical impression for plan     GOALS: Goals reviewed with patient? Yes  SHORT TERM GOALS: Target date: 11/22/2023      Pt will demo IND with HEP                    Baseline: Not IND            Goal status: INITIAL   LONG TERM GOALS: Target date: 01/03/2024    1.Pt will demo proper deep core coordination without chest breathing and optimal excursion of diaphragm/pelvic floor in order to promote spinal stability and pelvic floor function  Baseline: dyscoordination Goal status: INITIAL  2.  Pt will demo > 5 pt change on FOTO  to improve QOL and function  PFDI Urinary baseline -  Lower score = better function  Urinary Problem baseline-   Higher score = better function  Pelvic Pain baseline - Lower score = better function  Bowel  constipation baseline -   Higher score = better function  PFDI Bowel - Higher score = better function  Lumber baseline  -  Higher score = better function   Goal status: INITIAL  ( plan to assess next session)   3.  Pt will demo proper body mechanics in against gravity tasks and ADLs  work tasks, fitness  to minimize straining pelvic floor / back    Baseline: not IND, improper form that places strain on pelvic  floor  Goal status: INITIAL    4. Pt will demo increased gait speed > 1.3 m/s with reciprocal gait pattern, longer stride length  in order to ambulate safely in community and return to fitness routine  Baseline: 1.32 m/s, excessive sway to R  Goal status: INITIAL    5. Pt will practice sleeping on back to minimize misalignment of spine and pelvis and minimize LBP/ hip pain , sleeping side with pillow between knees and also report 1 week of waking in the moring with < 50% occurrence of the pain at both areas   Baseline: sleep on the R side and on belly   Goal status: Ongoing    6. Pt will report rectocole occurs outside the vaginal < 3 days out of 7 days and no bleeding in order to improve QOL  Baseline:  Across 4 days out of 7 days, it worsens to the point it rubs outside the vaginal and it bleeds.  Goal status: INITIAL   7. Pt will report no more straining  with bowel movements across 100% of the time  Baseline: Pt has daily bowel movements and has to strain about 50% of time.  Goal status: INITIAL   8.  Pt will demo levelled pelvic girdle and shoulder height in order to progress to deep core strengthening HEP and restore mobility at spine, pelvis, gait, posture minimize falls, and improve balance  Baseline: R shoulder, iliac rest lowered Goal:  MET ( Visit 2: levelled)    Pia Lupe Plump, PT 11/08/2023, 9:40 AM

## 2023-11-15 ENCOUNTER — Ambulatory Visit: Payer: 59 | Attending: Obstetrics and Gynecology | Admitting: Physical Therapy

## 2023-11-15 ENCOUNTER — Ambulatory Visit: Payer: 59 | Admitting: Physical Therapy

## 2023-11-15 DIAGNOSIS — M533 Sacrococcygeal disorders, not elsewhere classified: Secondary | ICD-10-CM | POA: Insufficient documentation

## 2023-11-15 DIAGNOSIS — R278 Other lack of coordination: Secondary | ICD-10-CM | POA: Insufficient documentation

## 2023-11-15 DIAGNOSIS — R2689 Other abnormalities of gait and mobility: Secondary | ICD-10-CM | POA: Diagnosis present

## 2023-11-15 NOTE — Patient Instructions (Addendum)
 Stretches :   Neck / shoulder stretches:    Lying on back - small sushi roll towel under neck  _ 6 directions   Chin up, down Rotation like changing lanes when driving Ear to shoulder like puppy dog   10 reps    _angel wings, lower elbows down , keep arms touching bed  10 reps    ____________  ZigZag stretch  Reclined twist for hips and side of the hips/ legs  Lay on your back, knees bend Scoot hips to the R , leave shoulders in place Wobble knees to the L side 45 deg and to midline  10 reps    For 5 min  resting onto pillows to keep leg at the same width of hips Pillow under L thigh to minimize too much strain    ___________

## 2023-11-15 NOTE — Therapy (Signed)
 OUTPATIENT PHYSICAL THERAPY  TREATMENT    Patient Name: Latoya Berry MRN: [id number] DOB:04/01/1966, 58 y.o., female Today's Date: 11/15/2023   PT End of Session - 11/15/23 1046     Visit Number 4    Number of Visits 10    Date for PT Re-Evaluation 01/03/24    PT Start Time 0803    PT Stop Time 0850    PT Time Calculation (min) 47 min    Activity Tolerance Patient tolerated treatment well;No increased pain    Behavior During Therapy WFL for tasks assessed/performed             Past Medical History:  Diagnosis Date   Allergy    Asthma    GERD (gastroesophageal reflux disease)    IUD (intrauterine device) in place    Menstrual migraine    Psoriasis    Past Surgical History:  Procedure Laterality Date   NASAL SEPTUM SURGERY     Patient Active Problem List   Diagnosis Date Noted   Genetic testing 10/22/2023   Tennis elbow 12/11/2012   Multiple nevi 12/15/2011   Migraine, menstrual 11/24/2011   Asthma, mild persistent 11/24/2011   Allergic rhinitis 11/24/2011    PCP: Dorise   REFERRING PROVIDER: Schermerhorn   REFERRING DIAG: N81.6 (ICD-10-CM) - Rectocele  Rationale for Evaluation and Treatment Rehabilitation  THERAPY DIAG:  Sacrococcygeal disorders, not elsewhere classified  Other abnormalities of gait and mobility  Other lack of coordination  ONSET DATE:   SUBJECTIVE:                                                                                                                                                                                           SUBJECTIVE STATEMENT TODAY: Pt reports her tailbone is bothering her   SUBJECTIVE STATEMENT ON EVAL 10/25/23  1) rectocele:  Pt has saw her PCP who told her that surgery is best. Pt saw a urogyn MD who did not think it will require surgery and to leave to be.  Sometimes, she feels a bulge she can not put back. She can not pinpoint what worsens it.  Across 4 days out of 7 days, it worsens to the  point it rubs outside the vaginal and it bleeds and hemorrhoids will form. Pt has daily bowel movements and has to strain about 50% of time.    2) LBP radiating to back of thigh L /  L hip pain by the glut:   4/10 pain  worst when waking up the morning, sleep on the R side and on belly     4) B plantar fasciitis ( R worse  than L) :  occurs in the morning, after playing pickleball if she does not wear proper shoes and not stretch properly.     PERTINENT HISTORY:  Plays Pickleball  Appendectomy , perineal tears with  dtr ( only child) who was suctioned out    PAIN:  Are you having pain? Yes , see above   PRECAUTIONS: None  WEIGHT BEARING RESTRICTIONS: No   FALLS:  Has patient fallen in last 6 months? No  LIVING ENVIRONMENT: Lives with:family  Lives in: two story home  Stairs:   OCCUPATION:  teacher   PLOF:    PATIENT GOALS:  Address rectocele and LBP, feet pain   OBJECTIVE:   [facility] PT Assessment - 11/15/23 1041       Palpation   Palpation comment hypomobile T/L , T12 deviated to L,  C/T junction deviated to L, dowagers hump,  R SIJ hyomobile , L posterior pelvic floor tightness             [facility] Adult PT Treatment/Exercise - 11/15/23 0932       Therapeutic Activites    Other Therapeutic Activities guided relaxation practices, explained biopsychosocial approaches      Neuro Re-ed    Neuro Re-ed Details  cued for neck/ scapular, back stretches to minimzie forward head posture/ thoracic kyphosis      Modalities   Modalities Moist Heat      Moist Heat Therapy   Number Minutes Moist Heat 3 Minutes    Moist Heat Location --   back     Manual Therapy   Manual therapy comments PA mob grade III at T/L junction,  distraction, STM/MWM at C/T junction, STM/MWM at R SIJ to promote mobility               HOME EXERCISE PROGRAM: See pt instruction section    ASSESSMENT:  CLINICAL IMPRESSION:     Tailbone is less flexed but  hypomobility at C/T and T/L  junctions and R SIJ required further manual Tx. Modified techniques to accommodate comfort.    Provided relaxation practices, explained biopsychosocial approaches to help decrease tight muscles and upper trap overuse.    Anticipate past Tx will help pt restore upright posture and correct forward head  thoracic kyphosis.      Plan to progress to deep core training next session to help promote optimize IAP system for improved pelvic floor function, trunk stability, gait, balance, stabilization with mobility tasks.  Plan to address pelvic floor issues once pelvis and spine are realigned to yield better outcomes.    Pt benefits from skilled PT.    OBJECTIVE IMPAIRMENTS decreased activity tolerance, decreased coordination, decreased endurance, decreased mobility, difficulty walking, decreased ROM, decreased strength, decreased safety awareness, hypomobility, increased muscle spasms, impaired flexibility, improper body mechanics, postural dysfunction, and pain. scar restrictions   ACTIVITY LIMITATIONS  self-care,  sleep, home chores, work tasks    PARTICIPATION LIMITATIONS:  community, gym activities    PERSONAL FACTORS        are also affecting patient's functional outcome.    REHAB POTENTIAL: Good   CLINICAL DECISION MAKING: Evolving/moderate complexity   EVALUATION COMPLEXITY: Moderate    PATIENT EDUCATION:    Education details: Showed pt anatomy images. Explained muscles attachments/ connection, physiology of deep core system/ spinal- thoracic-pelvis-lower kinetic chain as they relate to pt's presentation, Sx, and past Hx. Explained what and how these areas of deficits need to be restored to balance and function    See Therapeutic activity /  neuromuscular re-education section  Answered pt's questions.   Person educated: Patient Education method: Explanation, Demonstration, Tactile cues, Verbal cues, and Handouts Education comprehension: verbalized understanding, returned  demonstration, verbal cues required, tactile cues required, and needs further education     PLAN: PT FREQUENCY: 1x/week   PT DURATION: 10 weeks   PLANNED INTERVENTIONS: Therapeutic exercises, Therapeutic activity, Neuromuscular re-education, Balance training, Gait training, Patient/Family education, Self Care, Joint mobilization, Spinal mobilization, Moist heat, Taping, and Manual therapy, dry needling.   PLAN FOR NEXT SESSION: See clinical impression for plan     GOALS: Goals reviewed with patient? Yes  SHORT TERM GOALS: Target date: 11/22/2023      Pt will demo IND with HEP                    Baseline: Not IND            Goal status: INITIAL   LONG TERM GOALS: Target date: 01/03/2024    1.Pt will demo proper deep core coordination without chest breathing and optimal excursion of diaphragm/pelvic floor in order to promote spinal stability and pelvic floor function  Baseline: dyscoordination Goal status: INITIAL  2.  Pt will demo > 5 pt change on FOTO  to improve QOL and function  PFDI Urinary baseline -  Lower score = better function  Urinary Problem baseline-   Higher score = better function  Pelvic Pain baseline - Lower score = better function  Bowel  constipation baseline -   Higher score = better function  PFDI Bowel - Higher score = better function  Lumber baseline  -  Higher score = better function   Goal status: INITIAL  ( plan to assess next session)   3.  Pt will demo proper body mechanics in against gravity tasks and ADLs  work tasks, fitness  to minimize straining pelvic floor / back    Baseline: not IND, improper form that places strain on pelvic floor  Goal status: INITIAL    4. Pt will demo increased gait speed > 1.3 m/s with reciprocal gait pattern, longer stride length  in order to ambulate safely in community and return to fitness routine  Baseline: 1.32 m/s, excessive sway to R  Goal status: INITIAL    5. Pt will practice  sleeping on back to minimize misalignment of spine and pelvis and minimize LBP/ hip pain , sleeping side with pillow between knees and also report 1 week of waking in the moring with < 50% occurrence of the pain at both areas   Baseline: sleep on the R side and on belly   Goal status: Ongoing    6. Pt will report rectocole occurs outside the vaginal < 3 days out of 7 days and no bleeding in order to improve QOL  Baseline:  Across 4 days out of 7 days, it worsens to the point it rubs outside the vaginal and it bleeds.  Goal status: INITIAL   7. Pt will report no more straining with bowel movements across 100% of the time  Baseline: Pt has daily bowel movements and has to strain about 50% of time.  Goal status: INITIAL   8.  Pt will demo levelled pelvic girdle and shoulder height in order to progress to deep core strengthening HEP and restore mobility at spine, pelvis, gait, posture minimize falls, and improve balance  Baseline: R shoulder, iliac rest lowered Goal:  MET ( Visit 2: levelled)    Pia Lupe Plump, PT 11/15/2023, 10:47  AM

## 2023-11-20 ENCOUNTER — Ambulatory Visit: Payer: 59 | Admitting: Physical Therapy

## 2023-11-20 DIAGNOSIS — M533 Sacrococcygeal disorders, not elsewhere classified: Secondary | ICD-10-CM | POA: Diagnosis not present

## 2023-11-20 DIAGNOSIS — R2689 Other abnormalities of gait and mobility: Secondary | ICD-10-CM

## 2023-11-20 DIAGNOSIS — R278 Other lack of coordination: Secondary | ICD-10-CM

## 2023-11-20 NOTE — Patient Instructions (Addendum)
   Feet slides :   Points of contact at sitting bones  Four points of contact of foot,  Side knee back while keeping knee out along 2-3rd toe line   Heel up, ankle not twist out Lower heel while keeping knee out along 2-3rd toe line Four points of contact of foot, Slide foot back while keeping knee out along 2-3rd toe line   Repeated with other foot    2 min __  Walking with lifting knees, pushing ground away with ballmounds

## 2023-11-20 NOTE — Therapy (Signed)
 OUTPATIENT PHYSICAL THERAPY  TREATMENT    Patient Name: Chasty Randal MRN: [id number] DOB:20-Mar-1966, 58 y.o., female Today's Date: 11/20/2023   PT End of Session - 11/20/23 0851     Visit Number 5    Number of Visits 10    Date for PT Re-Evaluation 01/03/24    PT Start Time 0850    PT Stop Time 0930    PT Time Calculation (min) 40 min    Activity Tolerance Patient tolerated treatment well;No increased pain    Behavior During Therapy WFL for tasks assessed/performed             Past Medical History:  Diagnosis Date   Allergy    Asthma    GERD (gastroesophageal reflux disease)    IUD (intrauterine device) in place    Menstrual migraine    Psoriasis    Past Surgical History:  Procedure Laterality Date   NASAL SEPTUM SURGERY     Patient Active Problem List   Diagnosis Date Noted   Genetic testing 10/22/2023   Tennis elbow 12/11/2012   Multiple nevi 12/15/2011   Migraine, menstrual 11/24/2011   Asthma, mild persistent 11/24/2011   Allergic rhinitis 11/24/2011    PCP: Dorise   REFERRING PROVIDER: Schermerhorn   REFERRING DIAG: N81.6 (ICD-10-CM) - Rectocele  Rationale for Evaluation and Treatment Rehabilitation  THERAPY DIAG:  Sacrococcygeal disorders, not elsewhere classified  Other abnormalities of gait and mobility  Other lack of coordination  ONSET DATE:   SUBJECTIVE:                                                                                                                                                                                           SUBJECTIVE STATEMENT TODAY: Pt reports she felt great with the Pelvic Tx last week. She sensed the urge more to go to the bathroom for bowel movements. Pt can feel the rectocele bulging and she had to put it back/ With putting it back , she felt she actually needed to go to the bathroom and was able to go with nothing blocking it.   Pt was able to play pickle ball indoors. Pt did notice when she was  standing in certain position , she felt the rectocele and she had to t go to the bathroom to put it back.    Pt stopped sleeping on her belly and she has not noticed the LBP radiating to the back of L thigh / glut upon waking.  If she sits for 2 hrs in her incliner, she notices her LBP radiating pain. Pt plans to not binge watch on recliner for 2 hours  and instead, she will stand up and fold clothes.   B plantar fasciitis has been better but it is still occurring after playing pickle ball ( R foot).   SUBJECTIVE STATEMENT ON EVAL 10/25/23  1) rectocele:  Pt has saw her PCP who told her that surgery is best. Pt saw a urogyn MD who did not think it will require surgery and to leave to be.  Sometimes, she feels a bulge she can not put back. She can not pinpoint what worsens it.  Across 4 days out of 7 days, it worsens to the point it rubs outside the vaginal and it bleeds and hemorrhoids will form. Pt has daily bowel movements and has to strain about 50% of time.    2) LBP radiating to back of thigh L /  L hip pain by the glut:   4/10 pain  worst when waking up the morning, sleep on the R side and on belly     4) B plantar fasciitis ( R worse than L) :  occurs in the morning, after playing pickleball if she does not wear proper shoes and not stretch properly.     PERTINENT HISTORY:  Plays Pickleball  Appendectomy , perineal tears with  dtr ( only child) who was suctioned out    PAIN:  Are you having pain? Yes , see above   PRECAUTIONS: None  WEIGHT BEARING RESTRICTIONS: No   FALLS:  Has patient fallen in last 6 months? No  LIVING ENVIRONMENT: Lives with:family  Lives in: two story home  Stairs:   OCCUPATION:  runner, broadcasting/film/video   PLOF:    PATIENT GOALS:  Address rectocele and LBP, feet pain   OBJECTIVE:       HOME EXERCISE PROGRAM: See pt instruction section    ASSESSMENT:  CLINICAL IMPRESSION:   Past Tx helped as Pt reports she felt great with the Pelvic Tx last week.  She sensed the urge more to go to the bathroom for bowel movements. Pt can feel the rectocele bulging and she had to put it back/ With putting it back , she felt she actually needed to go to the bathroom and was able to go with nothing blocking it.   B plantar fasciitis has been better but it is still occurring after playing pickle ball ( R foot).  Therefore, focused on LKC deficits ( hypomobile midfoot) today to address supination/ stiff foot which is contributing to her plantar fasciitis.  Pt demo'd improved gait mechanics post Tx and more mobility at feet which will help promote anterior tilt of pelvic, nutation of sacrum, less flexed coccyx and in turn, help with minimizing prolapse.  Plan to progress with [facility] HEP  and deep core training next session to help promote optimize IAP system for improved pelvic floor function, trunk stability, gait, balance, stabilization with mobility tasks.  Plan to address pelvic floor issues after structural realignment has been addressed to  yield better outcomes.    Pt benefits from skilled PT.    OBJECTIVE IMPAIRMENTS decreased activity tolerance, decreased coordination, decreased endurance, decreased mobility, difficulty walking, decreased ROM, decreased strength, decreased safety awareness, hypomobility, increased muscle spasms, impaired flexibility, improper body mechanics, postural dysfunction, and pain. scar restrictions   ACTIVITY LIMITATIONS  self-care,  sleep, home chores, work tasks    PARTICIPATION LIMITATIONS:  community, gym activities    PERSONAL FACTORS        are also affecting patient's functional outcome.    REHAB POTENTIAL: Good   CLINICAL  DECISION MAKING: Evolving/moderate complexity   EVALUATION COMPLEXITY: Moderate    PATIENT EDUCATION:    Education details: Showed pt anatomy images. Explained muscles attachments/ connection, physiology of deep core system/ spinal- thoracic-pelvis-lower kinetic chain as they relate to pt's  presentation, Sx, and past Hx. Explained what and how these areas of deficits need to be restored to balance and function    See Therapeutic activity / neuromuscular re-education section  Answered pt's questions.   Person educated: Patient Education method: Explanation, Demonstration, Tactile cues, Verbal cues, and Handouts Education comprehension: verbalized understanding, returned demonstration, verbal cues required, tactile cues required, and needs further education     PLAN: PT FREQUENCY: 1x/week   PT DURATION: 10 weeks   PLANNED INTERVENTIONS: Therapeutic exercises, Therapeutic activity, Neuromuscular re-education, Balance training, Gait training, Patient/Family education, Self Care, Joint mobilization, Spinal mobilization, Moist heat, Taping, and Manual therapy, dry needling.   PLAN FOR NEXT SESSION: See clinical impression for plan     GOALS: Goals reviewed with patient? Yes  SHORT TERM GOALS: Target date: 11/22/2023      Pt will demo IND with HEP                    Baseline: Not IND            Goal status: INITIAL   LONG TERM GOALS: Target date: 01/03/2024    1.Pt will demo proper deep core coordination without chest breathing and optimal excursion of diaphragm/pelvic floor in order to promote spinal stability and pelvic floor function  Baseline: dyscoordination Goal status: INITIAL  2.  Pt will demo > 5 pt change on FOTO  to improve QOL and function  PFDI Urinary baseline -  Lower score = better function  Urinary Problem baseline-   Higher score = better function  Pelvic Pain baseline - Lower score = better function  Bowel  constipation baseline -   Higher score = better function  PFDI Bowel - Higher score = better function  Lumber baseline  -  Higher score = better function   Goal status: INITIAL  ( plan to assess next session)   3.  Pt will demo proper body mechanics in against gravity tasks and ADLs  work tasks, fitness  to minimize  straining pelvic floor / back    Baseline: not IND, improper form that places strain on pelvic floor  Goal status: INITIAL    4. Pt will demo increased gait speed > 1.3 m/s with reciprocal gait pattern, longer stride length  in order to ambulate safely in community and return to fitness routine  Baseline: 1.32 m/s, excessive sway to R  Goal status: INITIAL    5. Pt will practice sleeping on back to minimize misalignment of spine and pelvis and minimize LBP/ hip pain , sleeping side with pillow between knees and also report 1 week of waking in the moring with < 50% occurrence of the pain at both areas   Baseline: sleep on the R side and on belly   Goal status: Ongoing    6. Pt will report rectocole occurs outside the vaginal < 3 days out of 7 days and no bleeding in order to improve QOL  Baseline:  Across 4 days out of 7 days, it worsens to the point it rubs outside the vaginal and it bleeds.  Goal status: INITIAL   7. Pt will report no more straining with bowel movements across 100% of the time  Baseline: Pt has daily bowel movements  and has to strain about 50% of time.  Goal status: INITIAL   8.  Pt will demo levelled pelvic girdle and shoulder height in order to progress to deep core strengthening HEP and restore mobility at spine, pelvis, gait, posture minimize falls, and improve balance  Baseline: R shoulder, iliac rest lowered Goal:  MET ( Visit 2: levelled)    Pia Lupe Plump, PT 11/20/2023, 8:52 AM

## 2023-11-27 ENCOUNTER — Encounter: Payer: BC Managed Care – PPO | Admitting: Physical Therapy

## 2023-11-27 ENCOUNTER — Encounter: Payer: Self-pay | Admitting: Physical Therapy

## 2023-11-30 ENCOUNTER — Ambulatory Visit: Payer: 59 | Admitting: Physical Therapy

## 2023-11-30 DIAGNOSIS — R2689 Other abnormalities of gait and mobility: Secondary | ICD-10-CM

## 2023-11-30 DIAGNOSIS — M533 Sacrococcygeal disorders, not elsewhere classified: Secondary | ICD-10-CM

## 2023-11-30 DIAGNOSIS — R278 Other lack of coordination: Secondary | ICD-10-CM

## 2023-11-30 NOTE — Therapy (Signed)
OUTPATIENT PHYSICAL THERAPY  TREATMENT    Patient Name: Naryiah Schley MRN: [id number] DOB:1966/03/20, 58 y.o., female Today's Date: 11/30/2023   PT End of Session - 11/30/23 0930     Visit Number 6    Number of Visits 10    Date for PT Re-Evaluation 01/03/24    PT Start Time 0845    PT Stop Time 0930    PT Time Calculation (min) 45 min    Activity Tolerance Patient tolerated treatment well;No increased pain    Behavior During Therapy WFL for tasks assessed/performed             Past Medical History:  Diagnosis Date   Allergy    Asthma    GERD (gastroesophageal reflux disease)    IUD (intrauterine device) in place    Menstrual migraine    Psoriasis    Past Surgical History:  Procedure Laterality Date   NASAL SEPTUM SURGERY     Patient Active Problem List   Diagnosis Date Noted   Genetic testing 10/22/2023   Tennis elbow 12/11/2012   Multiple nevi 12/15/2011   Migraine, menstrual 11/24/2011   Asthma, mild persistent 11/24/2011   Allergic rhinitis 11/24/2011    PCP: Judie Grieve   REFERRING PROVIDER: Schermerhorn   REFERRING DIAG: N81.6 (ICD-10-CM) - Rectocele  Rationale for Evaluation and Treatment Rehabilitation  THERAPY DIAG:  Sacrococcygeal disorders, not elsewhere classified  Other abnormalities of gait and mobility  Other lack of coordination  ONSET DATE:   SUBJECTIVE:                                                                                                                                                                                           SUBJECTIVE STATEMENT TODAY: Pt reports she felt her prolapse drop with pickleball playing which she started this week for the first time since starting PT. Since starting PT, pt had not felt the prolapse drop. With this incident, she had to go to bathroom and put it back inside.    SUBJECTIVE STATEMENT ON EVAL 10/25/23  1) rectocele:  Pt has saw her PCP who told her that surgery is best. Pt saw a  urogyn MD who did not think it will require surgery and to leave to be.  Sometimes, she feels a bulge she can not put back. She can not pinpoint what worsens it.  Across 4 days out of 7 days, it worsens to the point it rubs outside the vaginal and it bleeds and hemorrhoids will form. Pt has daily bowel movements and has to strain about 50% of time.    2) LBP radiating to back of  thigh L /  L hip pain by the glut:   4/10 pain  worst when waking up the morning, sleep on the R side and on belly     4) B plantar fasciitis ( R worse than L) :  occurs in the morning, after playing pickleball if she does not wear proper shoes and not stretch properly.     PERTINENT HISTORY:  Plays Pickleball  Appendectomy , perineal tears with  dtr ( only child) who was suctioned out    PAIN:  Are you having pain? Yes , see above   PRECAUTIONS: None  WEIGHT BEARING RESTRICTIONS: No   FALLS:  Has patient fallen in last 6 months? No  LIVING ENVIRONMENT: Lives with:family  Lives in: two story home  Stairs:   OCCUPATION:  teacher   PLOF:    PATIENT GOALS:  Address rectocele and LBP, feet pain   OBJECTIVE:   [facility] PT Assessment - 11/30/23 1101       Other:   Other/Comments simulated pickleball movements: demo'd supination, toe gripping             [facility] Adult PT Treatment/Exercise - 11/30/23 1101       Ambulation/Gait   Gait Comments short stride, minimal arm swings, posterir COM, minimal hip/ k nee flexion      Neuro Re-ed    Neuro Re-ed Details  excessive cues for push off, hip flexion, anterior CO in gait , cued for new LKC mobility exericse to promote feet mobility      Manual Therapy   Manual therapy comments distraction, STM/ MWM at midfeet bones, lateral leg, plantar fascia R               HOME EXERCISE PROGRAM: See pt instruction section    ASSESSMENT:  CLINICAL IMPRESSION:  Improvements overall: Pt reports she felt great with the Pelvic Tx last week. She sensed  the urge more to go to the bathroom for bowel movements. Pt can feel the rectocele bulging and she had to put it back/ With putting it back , she felt she actually needed to go to the bathroom and was able to go with nothing blocking it.  Pt is maintaining levelled pelvic and shoulder alignment and upright posture   Working on:    Continued to focused on [facility] deficits ( hypomobile midfoot) today to address supination/ stiff foot which is contributing to her plantar fasciitis which impacts prolapse. Pt reports she experienced lowering of prolapse when pickle ball playing but not outside this activity since starting PT.  Prioritizing address poor feet alignment/ mobility to help optimize feet co-activation with deep core system. When pt simulated pickleball movements, pt demo'd poor activation of feet and pylometrics. Required cues for more co-activation of transverse arches and less supination/ toe gripping. Manual Tx helped to promote lowered arch and more DF/EV, toe abduction, less supination. R > L with more deficits.     Plan to progress with [facility] HEP  and deep core training next session to help promote optimize IAP system for improved pelvic floor function, trunk stability, gait, balance, stabilization with mobility tasks.  Plan to address pelvic floor issues after structural realignment has been addressed to  yield better outcomes.    Pt benefits from skilled PT.    OBJECTIVE IMPAIRMENTS decreased activity tolerance, decreased coordination, decreased endurance, decreased mobility, difficulty walking, decreased ROM, decreased strength, decreased safety awareness, hypomobility, increased muscle spasms, impaired flexibility, improper body mechanics, postural dysfunction, and pain. scar restrictions  ACTIVITY LIMITATIONS  self-care,  sleep, home chores, work tasks    PARTICIPATION LIMITATIONS:  community, gym activities    PERSONAL FACTORS        are also affecting patient's functional outcome.     REHAB POTENTIAL: Good   CLINICAL DECISION MAKING: Evolving/moderate complexity   EVALUATION COMPLEXITY: Moderate    PATIENT EDUCATION:    Education details: Showed pt anatomy images. Explained muscles attachments/ connection, physiology of deep core system/ spinal- thoracic-pelvis-lower kinetic chain as they relate to pt's presentation, Sx, and past Hx. Explained what and how these areas of deficits need to be restored to balance and function    See Therapeutic activity / neuromuscular re-education section  Answered pt's questions.   Person educated: Patient Education method: Explanation, Demonstration, Tactile cues, Verbal cues, and Handouts Education comprehension: verbalized understanding, returned demonstration, verbal cues required, tactile cues required, and needs further education     PLAN: PT FREQUENCY: 1x/week   PT DURATION: 10 weeks   PLANNED INTERVENTIONS: Therapeutic exercises, Therapeutic activity, Neuromuscular re-education, Balance training, Gait training, Patient/Family education, Self Care, Joint mobilization, Spinal mobilization, Moist heat, Taping, and Manual therapy, dry needling.   PLAN FOR NEXT SESSION: See clinical impression for plan     GOALS: Goals reviewed with patient? Yes  SHORT TERM GOALS: Target date: 11/22/2023      Pt will demo IND with HEP                    Baseline: Not IND            Goal status: INITIAL   LONG TERM GOALS: Target date: 01/03/2024    1.Pt will demo proper deep core coordination without chest breathing and optimal excursion of diaphragm/pelvic floor in order to promote spinal stability and pelvic floor function  Baseline: dyscoordination Goal status: INITIAL  2.  Pt will demo > 5 pt change on FOTO  to improve QOL and function  PFDI Urinary baseline -  Lower score = better function  Urinary Problem baseline-   Higher score = better function  Pelvic Pain baseline - Lower score = better function  Bowel   constipation baseline -   Higher score = better function  PFDI Bowel - Higher score = better function  Lumber baseline  -  Higher score = better function   Goal status: INITIAL  ( plan to assess next session)   3.  Pt will demo proper body mechanics in against gravity tasks and ADLs  work tasks, fitness  to minimize straining pelvic floor / back    Baseline: not IND, improper form that places strain on pelvic floor  Goal status: INITIAL    4. Pt will demo increased gait speed > 1.3 m/s with reciprocal gait pattern, longer stride length  in order to ambulate safely in community and return to fitness routine  Baseline: 1.32 m/s, excessive sway to R  Goal status: INITIAL    5. Pt will practice sleeping on back to minimize misalignment of spine and pelvis and minimize LBP/ hip pain , sleeping side with pillow between knees and also report 1 week of waking in the moring with < 50% occurrence of the pain at both areas   Baseline: sleep on the R side and on belly   Goal status: Ongoing    6. Pt will report rectocole occurs outside the vaginal < 3 days out of 7 days and no bleeding in order to improve QOL  Baseline:  Across 4  days out of 7 days, it worsens to the point it rubs outside the vaginal and it bleeds.  Goal status: INITIAL   7. Pt will report no more straining with bowel movements across 100% of the time  Baseline: Pt has daily bowel movements and has to strain about 50% of time.  Goal status: INITIAL   8.  Pt will demo levelled pelvic girdle and shoulder height in order to progress to deep core strengthening HEP and restore mobility at spine, pelvis, gait, posture minimize falls, and improve balance  Baseline: R shoulder, iliac rest lowered Goal:  MET ( Visit 2: levelled)    Mariane Masters, PT 11/30/2023, 11:02 AM

## 2023-12-03 ENCOUNTER — Encounter: Payer: 59 | Admitting: Physical Therapy

## 2023-12-05 ENCOUNTER — Ambulatory Visit: Payer: 59 | Admitting: Physical Therapy

## 2023-12-05 ENCOUNTER — Ambulatory Visit: Payer: Self-pay

## 2023-12-05 DIAGNOSIS — R2689 Other abnormalities of gait and mobility: Secondary | ICD-10-CM

## 2023-12-05 DIAGNOSIS — R278 Other lack of coordination: Secondary | ICD-10-CM

## 2023-12-05 DIAGNOSIS — M533 Sacrococcygeal disorders, not elsewhere classified: Secondary | ICD-10-CM | POA: Diagnosis not present

## 2023-12-05 NOTE — Patient Instructions (Signed)
Letter T, [place] on one leg with band band under L foot, wrap by big toe then, outer knee/ thigh, L hand pulling ( elbow by side)  Plant [place], toes spread, thigh out against band,, tracking knee in line with 2-3rd toe line Navel over knee , knee over [place]  Dipping forward, R foot lifts slight ( whole body like a see saw) off floor or  Press back foot against wall 20 reps  Both

## 2023-12-05 NOTE — Therapy (Signed)
OUTPATIENT PHYSICAL THERAPY  TREATMENT    Patient Name: Latoya Berry MRN: [id number] DOB:[birth year], 58 y.o., female Today's Date: 12/05/2023   PT End of Session - 12/05/23 0956     Visit Number 7    Number of Visits 10    Date for PT Re-Evaluation 01/03/24    PT Start Time 0803    PT Stop Time 0850    PT Time Calculation (min) 47 min    Activity Tolerance Patient tolerated treatment well;No increased pain    Behavior During Therapy WFL for tasks assessed/performed             Past Medical History:  Diagnosis Date   Allergy    Asthma    GERD (gastroesophageal reflux disease)    IUD (intrauterine device) in place    Menstrual migraine    Psoriasis    Past Surgical History:  Procedure Laterality Date   NASAL SEPTUM SURGERY     Patient Active Problem List   Diagnosis Date Noted   Genetic testing 10/22/2023   Tennis elbow 12/11/2012   Multiple nevi 12/15/2011   Migraine, menstrual 11/24/2011   Asthma, mild persistent 11/24/2011   Allergic rhinitis 11/24/2011    PCP: Judie Grieve   REFERRING PROVIDER: Schermerhorn   REFERRING DIAG: N81.6 (ICD-10-CM) - Rectocele  Rationale for Evaluation and Treatment Rehabilitation  THERAPY DIAG:  Other abnormalities of gait and mobility  Other lack of coordination  Sacrococcygeal disorders, not elsewhere classified  ONSET DATE:   SUBJECTIVE:                                                                                                                                                                                           SUBJECTIVE STATEMENT TODAY: Pt reports she paid attention to her feet and knee alignment with pickleball playing. Prolapse is not propping as often by 10% since PT   Pt saw urogyn about surgery options. Pt will have another appt 01/04/24 for f/u. Pt stated she is not convinced about surgery as the only option for her prolapse.    SUBJECTIVE STATEMENT ON EVAL 10/25/23  1) rectocele:  Pt has saw  her PCP who told her that surgery is best. Pt saw a urogyn MD who did not think it will require surgery and to leave to be.  Sometimes, she feels a bulge she can not put back. She can not pinpoint what worsens it.  Across 4 days out of 7 days, it worsens to the point it rubs outside the vaginal and it bleeds and hemorrhoids will form. Pt has daily bowel movements and has to strain about 50% of time.  2) LBP radiating to back of thigh L /  L hip pain by the glut:   4/10 pain  worst when waking up the morning, sleep on the R side and on belly     4) B plantar fasciitis ( R worse than L) :  occurs in the morning, after playing pickleball if she does not wear proper shoes and not stretch properly.     PERTINENT HISTORY:  Plays Pickleball  Appendectomy , perineal tears with  dtr ( only child) who was suctioned out    PAIN:  Are you having pain? Yes , see above   PRECAUTIONS: None  WEIGHT BEARING RESTRICTIONS: No   FALLS:  Has patient fallen in last 6 months? No  LIVING ENVIRONMENT: Lives with:family  Lives in: two story home  Stairs:   OCCUPATION:  teacher   PLOF:    PATIENT GOALS:  Address rectocele and LBP, feet pain   OBJECTIVE:    [facility] PT Assessment - 12/05/23 1205       Other:   Other/Comments poor priopceiton with transversse arch co-activation with big toe, ER of knee B      Palpation   Palpation comment hypomobile midfoot B, lateral leg, tight plantar fascia ,             [facility] Adult PT Treatment/Exercise - 12/05/23 1159       Neuro Re-ed    Neuro Re-ed Details  excessive cues for LKC alignment and propioception with standing SLS HEP with band to promote ER/ abd knee , co-activation of transverse arch      Manual Therapy   Manual therapy comments distraction, STM/ MWM at midfeet bones, lateral leg, plantar fascia B                HOME EXERCISE PROGRAM: See pt instruction section    ASSESSMENT:  CLINICAL IMPRESSION:  Improvements  overall: Pt reports she felt great with the Pelvic Tx last week. She sensed the urge more to go to the bathroom for bowel movements. Pt can feel the rectocele bulging and she had to put it back/ With putting it back , she felt she actually needed to go to the bathroom and was able to go with nothing blocking it.  Pt is maintaining levelled pelvic and shoulder alignment and upright posture  Gradually improving feet mobility to minimize overactivity of pelvic floor . Pt practiced techniques of alignment when playing pickleball this past week  Working on:   Continuing to improve mobility in feet and knee alignment to minimize overactivity of pelvic floor, minimize prolapse, and to continue to place piuckleball with improved feet mechanics to minimize prolapse   Progressed to  standing SLS HEP with band to promote ER/ abd knee , co-activation of transverse arch . Provided excessive cues pelvic , knee, alignment. Pt demo'd correctly post Tx  Plan to progress with [facility] HEP  and deep core training next session to help promote optimize IAP system for improved pelvic floor function, trunk stability, gait, balance, stabilization with mobility tasks.  Plan to address pelvic floor issues after structural realignment has been addressed to  yield better outcomes.    Pt benefits from skilled PT.    OBJECTIVE IMPAIRMENTS decreased activity tolerance, decreased coordination, decreased endurance, decreased mobility, difficulty walking, decreased ROM, decreased strength, decreased safety awareness, hypomobility, increased muscle spasms, impaired flexibility, improper body mechanics, postural dysfunction, and pain. scar restrictions   ACTIVITY LIMITATIONS  self-care,  sleep, home chores, work tasks  PARTICIPATION LIMITATIONS:  community, gym activities    PERSONAL FACTORS        are also affecting patient's functional outcome.    REHAB POTENTIAL: Good   CLINICAL DECISION MAKING: Evolving/moderate complexity    EVALUATION COMPLEXITY: Moderate    PATIENT EDUCATION:    Education details: Showed pt anatomy images. Explained muscles attachments/ connection, physiology of deep core system/ spinal- thoracic-pelvis-lower kinetic chain as they relate to pt's presentation, Sx, and past Hx. Explained what and how these areas of deficits need to be restored to balance and function    See Therapeutic activity / neuromuscular re-education section  Answered pt's questions.   Person educated: Patient Education method: Explanation, Demonstration, Tactile cues, Verbal cues, and Handouts Education comprehension: verbalized understanding, returned demonstration, verbal cues required, tactile cues required, and needs further education     PLAN: PT FREQUENCY: 1x/week   PT DURATION: 10 weeks   PLANNED INTERVENTIONS: Therapeutic exercises, Therapeutic activity, Neuromuscular re-education, Balance training, Gait training, Patient/Family education, Self Care, Joint mobilization, Spinal mobilization, Moist heat, Taping, and Manual therapy, dry needling.   PLAN FOR NEXT SESSION: See clinical impression for plan     GOALS: Goals reviewed with patient? Yes  SHORT TERM GOALS: Target date: 11/22/2023      Pt will demo IND with HEP                    Baseline: Not IND            Goal status: INITIAL   LONG TERM GOALS: Target date: 01/03/2024    1.Pt will demo proper deep core coordination without chest breathing and optimal excursion of diaphragm/pelvic floor in order to promote spinal stability and pelvic floor function  Baseline: dyscoordination Goal status: INITIAL  2.  Pt will demo > 5 pt change on FOTO  to improve QOL and function  PFDI Urinary baseline -  Lower score = better function  Urinary Problem baseline-   Higher score = better function  Pelvic Pain baseline - Lower score = better function  Bowel  constipation baseline -   Higher score = better function  PFDI Bowel - Higher  score = better function  Lumber baseline  -  Higher score = better function   Goal status: INITIAL  ( plan to assess next session)   3.  Pt will demo proper body mechanics in against gravity tasks and ADLs  work tasks, fitness  to minimize straining pelvic floor / back    Baseline: not IND, improper form that places strain on pelvic floor  Goal status: INITIAL    4. Pt will demo increased gait speed > 1.3 m/s with reciprocal gait pattern, longer stride length  in order to ambulate safely in community and return to fitness routine  Baseline: 1.32 m/s, excessive sway to R  Goal status: INITIAL    5. Pt will practice sleeping on back to minimize misalignment of spine and pelvis and minimize LBP/ hip pain , sleeping side with pillow between knees and also report 1 week of waking in the moring with < 50% occurrence of the pain at both areas   Baseline: sleep on the R side and on belly   Goal status: Ongoing    6. Pt will report rectocole occurs outside the vaginal < 3 days out of 7 days and no bleeding in order to improve QOL  Baseline:  Across 4 days out of 7 days, it worsens to the point it rubs outside  the vaginal and it bleeds.  Goal status: INITIAL   7. Pt will report no more straining with bowel movements across 100% of the time  Baseline: Pt has daily bowel movements and has to strain about 50% of time.  Goal status: INITIAL   8.  Pt will demo levelled pelvic girdle and shoulder height in order to progress to deep core strengthening HEP and restore mobility at spine, pelvis, gait, posture minimize falls, and improve balance  Baseline: R shoulder, iliac rest lowered Goal:  MET ( Visit 2: levelled)    Mariane Masters, PT 12/05/2023, 9:57 AM

## 2023-12-11 ENCOUNTER — Ambulatory Visit: Payer: 59 | Attending: Obstetrics and Gynecology | Admitting: Physical Therapy

## 2023-12-11 DIAGNOSIS — R278 Other lack of coordination: Secondary | ICD-10-CM | POA: Insufficient documentation

## 2023-12-11 DIAGNOSIS — M533 Sacrococcygeal disorders, not elsewhere classified: Secondary | ICD-10-CM | POA: Insufficient documentation

## 2023-12-11 DIAGNOSIS — R2689 Other abnormalities of gait and mobility: Secondary | ICD-10-CM | POA: Diagnosis present

## 2023-12-11 NOTE — Therapy (Signed)
 OUTPATIENT PHYSICAL THERAPY  TREATMENT    Patient Name: Latoya Berry MRN: [id number] DOB:10-May-1966, 58 y.o., female Today's Date: 12/11/2023   PT End of Session - 12/11/23 0809     Visit Number 8    Number of Visits 10    Date for PT Re-Evaluation 01/03/24    PT Start Time 0803    PT Stop Time 0845    PT Time Calculation (min) 42 min    Activity Tolerance Patient tolerated treatment well;No increased pain    Behavior During Therapy WFL for tasks assessed/performed             Past Medical History:  Diagnosis Date   Allergy    Asthma    GERD (gastroesophageal reflux disease)    IUD (intrauterine device) in place    Menstrual migraine    Psoriasis    Past Surgical History:  Procedure Laterality Date   NASAL SEPTUM SURGERY     Patient Active Problem List   Diagnosis Date Noted   Genetic testing 10/22/2023   Tennis elbow 12/11/2012   Multiple nevi 12/15/2011   Migraine, menstrual 11/24/2011   Asthma, mild persistent 11/24/2011   Allergic rhinitis 11/24/2011    PCP: Dorise   REFERRING PROVIDER: Schermerhorn   REFERRING DIAG: N81.6 (ICD-10-CM) - Rectocele  Rationale for Evaluation and Treatment Rehabilitation  THERAPY DIAG:  Other abnormalities of gait and mobility  Other lack of coordination  Sacrococcygeal disorders, not elsewhere classified  ONSET DATE:   SUBJECTIVE:                                                                                                                                                                                           SUBJECTIVE STATEMENT TODAY: Pt reports her prolapse has decreased by 50% since starting Pelvic PT when playing pickleball. She still feels it and  she does have to put it back. 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 Pt saw  a urogyn MD who did not think it will require surgery and to leave to be.  Sometimes, she feels a bulge she can not put back. She can not pinpoint what worsens it.  Across 4 days out of 7 days, it worsens to the point it rubs outside the vaginal and it bleeds and hemorrhoids will form. Pt has daily bowel movements and has to strain about 50% of time.    2) LBP radiating to back of  thigh L /  L hip pain by the glut:   4/10 pain  worst when waking up the morning, sleep on the R side and on belly     4) B plantar fasciitis ( R worse than L) :  occurs in the morning, after playing pickleball if she does not wear proper shoes and not stretch properly.     PERTINENT HISTORY:  Plays Pickleball  Appendectomy , perineal tears with  dtr ( only child) who was suctioned out    PAIN:  Are you having pain? Yes , see above   PRECAUTIONS: None  WEIGHT BEARING RESTRICTIONS: No   FALLS:  Has patient fallen in last 6 months? No  LIVING ENVIRONMENT: Lives with:family  Lives in: two story home  Stairs:   OCCUPATION:  teacher   PLOF:    PATIENT GOALS:  Address rectocele and LBP, feet pain   OBJECTIVE:     [facility] PT Assessment - 12/11/23 1057       Observation/Other Assessments   Observations poor carry over with technique with last week's HEP with band for [facility]      Posture/Postural Control   Posture Comments upright posture,             [facility] Adult PT Treatment/Exercise - 12/11/23 0858       Self-Care   Other Self-Care Comments  discussed biopyschosocial approaches for relaxation to minimize urinary frequency,  explained upcoming sessions for focusing on pelvic floor/ minimizing prolapse.      Neuro Re-ed    Neuro Re-ed Details  excessive cues for last week's band exercise for great toe ballmound, knee abd/ER alignment and propioception and hip abd strengthening               HOME EXERCISE PROGRAM: See pt instruction section    ASSESSMENT:  CLINICAL  IMPRESSION:  Improvements overall: She sensed the urge more to go to the bathroom for bowel movements. Pt can feel the rectocele bulging and she had to put it back/ With putting it back , she felt she actually needed to go to the bathroom and was able to go with nothing blocking it.  Pt is maintaining levelled pelvic and shoulder alignment and upright posture  Gradually improving feet mobility to minimize overactivity of pelvic floor . Pt practiced techniques of alignment when playing pickleball this past week Pt reports her prolapse has decreased by 50% since starting Pelvic PT when playing pickleball. She still feels it and  she does have to put it back.(8th visit)    Working on:   Reviewing proper technique for last week's HEP as pt showed incorrect form.  Pt demo'd improved technique to promote ER/ abd knee , co-activation of transverse arch  with more pelvic, LKC proprioception post training.   Anticipate  mobility in feet and knee alignment to minimize overactivity of pelvic floor, minimize prolapse, and to continue to place piuckleball with improved feet mechanics to minimize prolapse   Also, explained correlation with urinary frequency and stress/ tight pelvic mm. Discussed biopyschosocial approaches for relaxation to minimize urinary frequency,  explained upcoming sessions for focusing on pelvic floor/ minimizing prolapse.                       Plan to progress  deep core training next session to help promote optimize IAP system for improved pelvic floor function, trunk stability, gait, balance, stabilization with mobility tasks.  Plan to address pelvic floor issues after structural  realignment has been addressed to  yield better outcomes.    Pt benefits from skilled PT.    OBJECTIVE IMPAIRMENTS decreased activity tolerance, decreased coordination, decreased endurance, decreased mobility, difficulty walking, decreased ROM, decreased strength, decreased safety awareness, hypomobility,  increased muscle spasms, impaired flexibility, improper body mechanics, postural dysfunction, and pain. scar restrictions   ACTIVITY LIMITATIONS  self-care,  sleep, home chores, work tasks    PARTICIPATION LIMITATIONS:  community, gym activities    PERSONAL FACTORS        are also affecting patient's functional outcome.    REHAB POTENTIAL: Good   CLINICAL DECISION MAKING: Evolving/moderate complexity   EVALUATION COMPLEXITY: Moderate    PATIENT EDUCATION:    Education details: Showed pt anatomy images. Explained muscles attachments/ connection, physiology of deep core system/ spinal- thoracic-pelvis-lower kinetic chain as they relate to pt's presentation, Sx, and past Hx. Explained what and how these areas of deficits need to be restored to balance and function    See Therapeutic activity / neuromuscular re-education section  Answered pt's questions.   Person educated: Patient Education method: Explanation, Demonstration, Tactile cues, Verbal cues, and Handouts Education comprehension: verbalized understanding, returned demonstration, verbal cues required, tactile cues required, and needs further education     PLAN: PT FREQUENCY: 1x/week   PT DURATION: 10 weeks   PLANNED INTERVENTIONS: Therapeutic exercises, Therapeutic activity, Neuromuscular re-education, Balance training, Gait training, Patient/Family education, Self Care, Joint mobilization, Spinal mobilization, Moist heat, Taping, and Manual therapy, dry needling.   PLAN FOR NEXT SESSION: See clinical impression for plan     GOALS: Goals reviewed with patient? Yes  SHORT TERM GOALS: Target date: 11/22/2023      Pt will demo IND with HEP                    Baseline: Not IND            Goal status: INITIAL   LONG TERM GOALS: Target date: 01/03/2024    1.Pt will demo proper deep core coordination without chest breathing and optimal excursion of diaphragm/pelvic floor in order to promote spinal stability and  pelvic floor function  Baseline: dyscoordination Goal status: INITIAL  2.  Pt will demo > 5 pt change on FOTO  to improve QOL and function  PFDI Urinary baseline -  Lower score = better function  Urinary Problem baseline-   Higher score = better function  Pelvic Pain baseline - Lower score = better function  Bowel  constipation baseline -   Higher score = better function  PFDI Bowel - Higher score = better function  Lumber baseline  -  Higher score = better function   Goal status: INITIAL  ( plan to assess next session)   3.  Pt will demo proper body mechanics in against gravity tasks and ADLs  work tasks, fitness  to minimize straining pelvic floor / back    Baseline: not IND, improper form that places strain on pelvic floor  Goal status: INITIAL    4. Pt will demo increased gait speed > 1.3 m/s with reciprocal gait pattern, longer stride length  in order to ambulate safely in community and return to fitness routine  Baseline: 1.32 m/s, excessive sway to R  Goal status: INITIAL    5. Pt will practice sleeping on back to minimize misalignment of spine and pelvis and minimize LBP/ hip pain , sleeping side with pillow between knees and also report 1 week of waking in the moring with <  50% occurrence of the pain at both areas   Baseline: sleep on the R side and on belly   Goal status: Ongoing    6. Pt will report rectocole occurs outside the vaginal < 3 days out of 7 days and no bleeding in order to improve QOL  Baseline:  Across 4 days out of 7 days, it worsens to the point it rubs outside the vaginal and it bleeds.  Goal status: INITIAL   7. Pt will report no more straining with bowel movements across 100% of the time  Baseline: Pt has daily bowel movements and has to strain about 50% of time.  Goal status: INITIAL   8.  Pt will demo levelled pelvic girdle and shoulder height in order to progress to deep core strengthening HEP and restore mobility at spine,  pelvis, gait, posture minimize falls, and improve balance  Baseline: R shoulder, iliac rest lowered Goal:  MET ( Visit 2: levelled)    Pia Lupe Plump, PT 12/11/2023, 8:09 AM

## 2023-12-11 NOTE — Patient Instructions (Signed)
Notice when stressed, take notices, points of contact, slow breath

## 2023-12-20 ENCOUNTER — Ambulatory Visit: Payer: 59 | Admitting: Physical Therapy

## 2023-12-20 DIAGNOSIS — R278 Other lack of coordination: Secondary | ICD-10-CM

## 2023-12-20 DIAGNOSIS — M533 Sacrococcygeal disorders, not elsewhere classified: Secondary | ICD-10-CM

## 2023-12-20 DIAGNOSIS — R2689 Other abnormalities of gait and mobility: Secondary | ICD-10-CM

## 2023-12-20 NOTE — Patient Instructions (Signed)
Feet strengthening and alignment of knees/ hip     Teeter totter with band ( no locking knees)  15 reps    Heel raises ( no locking knees)  15 reps     Heel raises - heels together, minisquat Hold onto counter   Minisquat motion, trunk bent , gaze onto floor like you are looking at your reflection over a lake/pond,  Knees bent pointed out like a "v" , navel ( center of mass) more forward  Heels together as you lift, pointed out like a "v"  KNEES ARE ALIGNED BEHIND THE TOES TO MINIMIZE STRAIN ON THE KNEES your  navel ( center of mass) more forward to a avoid dropping down fast and rocking more weight back onto heels , keep heels pressing against each other the whole time   30 reps      Deep core level 1 ( handout)   10 reps  x 2 day

## 2023-12-20 NOTE — Therapy (Signed)
 OUTPATIENT PHYSICAL THERAPY  TREATMENT    Patient Name: Latoya Berry MRN: [id number] DOB:[birth year], 58 y.o., female Today's Date: 12/20/2023   PT End of Session - 12/20/23 0906     Visit Number 9    Number of Visits 10    Date for PT Re-Evaluation 01/03/24    PT Start Time 0850    PT Stop Time 0930    PT Time Calculation (min) 40 min    Activity Tolerance Patient tolerated treatment well;No increased pain    Behavior During Therapy WFL for tasks assessed/performed             Past Medical History:  Diagnosis Date   Allergy    Asthma    GERD (gastroesophageal reflux disease)    IUD (intrauterine device) in place    Menstrual migraine    Psoriasis    Past Surgical History:  Procedure Laterality Date   NASAL SEPTUM SURGERY     Patient Active Problem List   Diagnosis Date Noted   Genetic testing 10/22/2023   Tennis elbow 12/11/2012   Multiple nevi 12/15/2011   Migraine, menstrual 11/24/2011   Asthma, mild persistent 11/24/2011   Allergic rhinitis 11/24/2011    PCP: Judie Grieve   REFERRING PROVIDER: Schermerhorn   REFERRING DIAG: N81.6 (ICD-10-CM) - Rectocele  Rationale for Evaluation and Treatment Rehabilitation  THERAPY DIAG:  Other abnormalities of gait and mobility  Other lack of coordination  Sacrococcygeal disorders, not elsewhere classified  ONSET DATE:   SUBJECTIVE:                                                                                                                                                                                           SUBJECTIVE STATEMENT TODAY: Pt reports she notices her prolapse more when she has not been as active with a consistent walking routine > 15 K steps and pickle ball once a week. Last week, she noticed prolapse and had decreased physical activity.  Pt has been doing her PT.  SUBJECTIVE STATEMENT ON EVAL 10/25/23  1) rectocele:  Pt has saw her PCP who told her that surgery is best. Pt saw a urogyn MD  who did not think it will require surgery and to leave to be.  Sometimes, she feels a bulge she can not put back. She can not pinpoint what worsens it.  Across 4 days out of 7 days, it worsens to the point it rubs outside the vaginal and it bleeds and hemorrhoids will form. Pt has daily bowel movements and has to strain about 50% of time.    2) LBP radiating to back of thigh L /  L hip pain by the glut:   4/10 pain  worst when waking up the morning, sleep on the R side and on belly     4) B plantar fasciitis ( R worse than L) :  occurs in the morning, after playing pickleball if she does not wear proper shoes and not stretch properly.     PERTINENT HISTORY:  Plays Pickleball  Appendectomy , perineal tears with  dtr ( only child) who was suctioned out    PAIN:  Are you having pain? Yes , see above   PRECAUTIONS: None  WEIGHT BEARING RESTRICTIONS: No   FALLS:  Has patient fallen in last 6 months? No  LIVING ENVIRONMENT: Lives with:family  Lives in: two story home  Stairs:   OCCUPATION:  teacher   PLOF:    PATIENT GOALS:  Address rectocele and LBP, feet pain   OBJECTIVE:     [facility] PT Assessment - 12/20/23 0924       Observation/Other Assessments   Observations poor carry over with past HEP with hyperextended knees,      Coordination   Coordination and Movement Description overuse of extensors for pelvic anterior tilt pelvis,  chest breathing with deep core training              [facility] Adult PT Treatment/Exercise - 12/20/23 0925       Therapeutic Activites    Other Therapeutic Activities reviewed her self selected stretches, discussed consolidating HEP, explained categories of HEP to help with strenghtening, and plan to help core with deep core training, and new stretches for flexibility at next session      Neuro Re-ed    Neuro Re-ed Details  excessive cues for deep core ( less chest breathing), cued for proper tehcnique with LKC HEP and added new LKC HEP                HOME EXERCISE PROGRAM: See pt instruction section    ASSESSMENT:  CLINICAL IMPRESSION:  Improvements overall: She sensed the urge more to go to the bathroom for bowel movements. Pt can feel the rectocele bulging and she had to put it back/ With putting it back , she felt she actually needed to go to the bathroom and was able to go with nothing blocking it.  Pt is maintaining levelled pelvic and shoulder alignment and upright posture  Gradually improving feet mobility to minimize overactivity of pelvic floor . Pt practiced techniques of alignment when playing pickleball this past week Pt reports her prolapse has decreased by 50% since starting Pelvic PT when playing pickleball. She still feels it and  she does have to put it back.(8th visit)    Working on:  Reviewed past HEP which pt required cues for not hyperextend knee. Added another [facility] strengthening exercise for the feet arch / gluts/ ER of knees which pt needed propcioeption cues.                           Added deep core level 1 training with cues for less chest breathing. Plan to progress to deep core level 2 next session and add well rounded stretching routine. Create chart for all HEP for better compliance and understanding.   These progressions will promote optimize IAP system for improved pelvic floor function, trunk stability, gait, balance, stabilization with mobility tasks.  Plan to address pelvic floor issues  at upcoming sessions.   Pt benefits from skilled PT.  OBJECTIVE IMPAIRMENTS decreased activity tolerance, decreased coordination, decreased endurance, decreased mobility, difficulty walking, decreased ROM, decreased strength, decreased safety awareness, hypomobility, increased muscle spasms, impaired flexibility, improper body mechanics, postural dysfunction, and pain. scar restrictions   ACTIVITY LIMITATIONS  self-care,  sleep, home chores, work tasks    PARTICIPATION LIMITATIONS:  community, gym  activities    PERSONAL FACTORS        are also affecting patient's functional outcome.    REHAB POTENTIAL: Good   CLINICAL DECISION MAKING: Evolving/moderate complexity   EVALUATION COMPLEXITY: Moderate    PATIENT EDUCATION:    Education details: Showed pt anatomy images. Explained muscles attachments/ connection, physiology of deep core system/ spinal- thoracic-pelvis-lower kinetic chain as they relate to pt's presentation, Sx, and past Hx. Explained what and how these areas of deficits need to be restored to balance and function    See Therapeutic activity / neuromuscular re-education section  Answered pt's questions.   Person educated: Patient Education method: Explanation, Demonstration, Tactile cues, Verbal cues, and Handouts Education comprehension: verbalized understanding, returned demonstration, verbal cues required, tactile cues required, and needs further education     PLAN: PT FREQUENCY: 1x/week   PT DURATION: 10 weeks   PLANNED INTERVENTIONS: Therapeutic exercises, Therapeutic activity, Neuromuscular re-education, Balance training, Gait training, Patient/Family education, Self Care, Joint mobilization, Spinal mobilization, Moist heat, Taping, and Manual therapy, dry needling.   PLAN FOR NEXT SESSION: See clinical impression for plan     GOALS: Goals reviewed with patient? Yes  SHORT TERM GOALS: Target date: 11/22/2023      Pt will demo IND with HEP                    Baseline: Not IND            Goal status: INITIAL   LONG TERM GOALS: Target date: 01/03/2024    1.Pt will demo proper deep core coordination without chest breathing and optimal excursion of diaphragm/pelvic floor in order to promote spinal stability and pelvic floor function  Baseline: dyscoordination Goal status: INITIAL  2.  Pt will demo > 5 pt change on FOTO  to improve QOL and function  PFDI Urinary baseline -  Lower score = better function  Urinary Problem baseline-   Higher  score = better function  Pelvic Pain baseline - Lower score = better function  Bowel  constipation baseline -   Higher score = better function  PFDI Bowel - Higher score = better function  Lumber baseline  -  Higher score = better function   Goal status: INITIAL  ( plan to assess next session)   3.  Pt will demo proper body mechanics in against gravity tasks and ADLs  work tasks, fitness  to minimize straining pelvic floor / back    Baseline: not IND, improper form that places strain on pelvic floor  Goal status: INITIAL    4. Pt will demo increased gait speed > 1.3 m/s with reciprocal gait pattern, longer stride length  in order to ambulate safely in community and return to fitness routine  Baseline: 1.32 m/s, excessive sway to R  Goal status: INITIAL    5. Pt will practice sleeping on back to minimize misalignment of spine and pelvis and minimize LBP/ hip pain , sleeping side with pillow between knees and also report 1 week of waking in the moring with < 50% occurrence of the pain at both areas   Baseline: sleep on the R side and on belly  Goal status: Ongoing    6. Pt will report rectocole occurs outside the vaginal < 3 days out of 7 days and no bleeding in order to improve QOL  Baseline:  Across 4 days out of 7 days, it worsens to the point it rubs outside the vaginal and it bleeds.  Goal status: INITIAL   7. Pt will report no more straining with bowel movements across 100% of the time  Baseline: Pt has daily bowel movements and has to strain about 50% of time.  Goal status: INITIAL   8.  Pt will demo levelled pelvic girdle and shoulder height in order to progress to deep core strengthening HEP and restore mobility at spine, pelvis, gait, posture minimize falls, and improve balance  Baseline: R shoulder, iliac rest lowered Goal:  MET ( Visit 2: levelled)    Mariane Masters, PT 12/20/2023, 9:06 AM

## 2023-12-24 ENCOUNTER — Ambulatory Visit: Payer: 59 | Admitting: Physical Therapy

## 2023-12-24 DIAGNOSIS — R2689 Other abnormalities of gait and mobility: Secondary | ICD-10-CM

## 2023-12-24 DIAGNOSIS — M533 Sacrococcygeal disorders, not elsewhere classified: Secondary | ICD-10-CM

## 2023-12-24 DIAGNOSIS — R278 Other lack of coordination: Secondary | ICD-10-CM

## 2023-12-24 NOTE — Patient Instructions (Addendum)
 Stretch for pelvic floor   Happy baby: Knees towards armpits  Breath 3-5 breaths, noticing pelvic floor relaxes   Alternating hamstring stretch from happy baby position" One knee straightens, ankle 90 deg, toes point towards floor like you are walking on ceiling which stretches adductors and hamstrings   Repeat with other side

## 2023-12-24 NOTE — Addendum Note (Signed)
 Addended by: Mariane Masters on: 12/24/2023 10:22 AM   Modules accepted: Orders

## 2023-12-24 NOTE — Therapy (Signed)
 d OUTPATIENT PHYSICAL THERAPY  TREATMENT  / progress  Note across 10 visit from 10/25/23- 12/23/22 / recert     Patient Name: Latoya Berry MRN: [id number] DOB:[birth year], 58 y.o., female Today's Date: 12/24/2023   PT End of Session - 12/24/23 0818     Visit Number 10    Number of Visits 20    Date for PT Re-Evaluation 03/03/24    PT Start Time 0805    PT Stop Time 0850    PT Time Calculation (min) 45 min    Activity Tolerance Patient tolerated treatment well;No increased pain    Behavior During Therapy WFL for tasks assessed/performed             Past Medical History:  Diagnosis Date   Allergy    Asthma    GERD (gastroesophageal reflux disease)    IUD (intrauterine device) in place    Menstrual migraine    Psoriasis    Past Surgical History:  Procedure Laterality Date   NASAL SEPTUM SURGERY     Patient Active Problem List   Diagnosis Date Noted   Genetic testing 10/22/2023   Tennis elbow 12/11/2012   Multiple nevi 12/15/2011   Migraine, menstrual 11/24/2011   Asthma, mild persistent 11/24/2011   Allergic rhinitis 11/24/2011    PCP: Judie Grieve   REFERRING PROVIDER: Schermerhorn   REFERRING DIAG: N81.6 (ICD-10-CM) - Rectocele  Rationale for Evaluation and Treatment Rehabilitation  THERAPY DIAG:  Other abnormalities of gait and mobility   Other lack of coordination   Sacrococcygeal disorders, not elsewhere classified  ONSET DATE:   SUBJECTIVE:                                                                                                                                                                                           SUBJECTIVE STATEMENT TODAY: Pt reports she made muffins with flax seeds, chia seeds, hemp seeds,   SUBJECTIVE STATEMENT ON EVAL 10/25/23  1) rectocele:  Pt has saw her PCP who told her that surgery is best. Pt saw a urogyn MD who did not think it will require surgery and to leave to be.  Sometimes, she feels a bulge she can not  put back. She can not pinpoint what worsens it.  Across 4 days out of 7 days, it worsens to the point it rubs outside the vaginal and it bleeds and hemorrhoids will form. Pt has daily bowel movements and has to strain about 50% of time.    2) LBP radiating to back of thigh L /  L hip pain by the glut:   4/10 pain  worst when waking  up the morning, sleep on the R side and on belly     4) B plantar fasciitis ( R worse than L) :  occurs in the morning, after playing pickleball if she does not wear proper shoes and not stretch properly.     PERTINENT HISTORY:  Plays Pickleball  Appendectomy , perineal tears with  dtr ( only child) who was suctioned out    PAIN:  Are you having pain? Yes , see above   PRECAUTIONS: None  WEIGHT BEARING RESTRICTIONS: No   FALLS:  Has patient fallen in last 6 months? No  LIVING ENVIRONMENT: Lives with:family  Lives in: two story home  Stairs:   OCCUPATION:  teacher   PLOF:    PATIENT GOALS:  Address rectocele and LBP, feet pain   OBJECTIVE:    [facility] PT Assessment - 12/24/23 0926       Observation/Other Assessments   Observations poor alignment in gaze and technique in last week's HEP ( minisquat/ heel press)      Palpation   Palpation comment hypomobile midfoot L, lateral leg, tight plantar fascia, h amstring, adductors L ,             [facility] Adult PT Treatment/Exercise - 12/24/23 0925       Therapeutic Activites    Other Therapeutic Activities reassessed goals for PN note and recert      Neuro Re-ed    Neuro Re-ed Details  cued for prope technique in minisquat heel press, cued for pelvic floor and hamstring/ adductor stretches      Manual Therapy   Manual therapy comments distraction, STM/ MWM at midfeet bones, lateral leg, plantar fascia L , adductor, gluts, ischio fossa mm attachments L                HOME EXERCISE PROGRAM: See pt instruction section    ASSESSMENT:  CLINICAL IMPRESSION:  Pt met 6/8 goals and  progressing well towards remaining goals.   Improvements overall: She sensed the urge more to go to the bathroom for bowel movements. Pt can feel the rectocele bulging and she had to put it back/ With putting it back , she felt she actually needed to go to the bathroom and was able to go with nothing blocking it.  Pt is maintaining levelled pelvic and shoulder alignment and upright posture  Gradually improving feet mobility to minimize overactivity of pelvic floor . Pt practiced techniques of alignment when playing pickleball this past week Pt reports her prolapse has decreased by 50% since starting Pelvic PT when playing pickleball. She still feels it and  she does have to put it back.(8th visit)    No more bleeding across past 2 weeks, rectocele outside the vaginal occurs 2 days out of the week ( Visit 10 with focus on LKC, and not directly at pelvic floor yet)   Working on:   [facility] strengthening exercise for the feet arch / gluts/ ER of knees which pt needed propcioeption cues. This improvement will help pt return to pickleball playing which involves pylometrics and plantar fascia mobility and intrinsic feet mm strength which pt has deficits with. Following today's session, pt reported   noticing her decreased lowered position of prolapse with minisquat-heelpress upright HEP.                            Plan to progress to deep core level 2 next session and add well rounded stretching routine.  Create chart for all HEP for better compliance and understanding.   These progressions will promote optimize IAP system for improved pelvic floor function, trunk stability, gait, balance, stabilization with mobility tasks.  Plan to address pelvic floor issues  at upcoming sessions.   Pt benefits from skilled PT.    OBJECTIVE IMPAIRMENTS decreased activity tolerance, decreased coordination, decreased endurance, decreased mobility, difficulty walking, decreased ROM, decreased strength, decreased safety  awareness, hypomobility, increased muscle spasms, impaired flexibility, improper body mechanics, postural dysfunction, and pain. scar restrictions   ACTIVITY LIMITATIONS  self-care,  sleep, home chores, work tasks    PARTICIPATION LIMITATIONS:  community, gym activities    PERSONAL FACTORS        are also affecting patient's functional outcome.    REHAB POTENTIAL: Good   CLINICAL DECISION MAKING: Evolving/moderate complexity   EVALUATION COMPLEXITY: Moderate    PATIENT EDUCATION:    Education details: Showed pt anatomy images. Explained muscles attachments/ connection, physiology of deep core system/ spinal- thoracic-pelvis-lower kinetic chain as they relate to pt's presentation, Sx, and past Hx. Explained what and how these areas of deficits need to be restored to balance and function    See Therapeutic activity / neuromuscular re-education section  Answered pt's questions.   Person educated: Patient Education method: Explanation, Demonstration, Tactile cues, Verbal cues, and Handouts Education comprehension: verbalized understanding, returned demonstration, verbal cues required, tactile cues required, and needs further education     PLAN: PT FREQUENCY: 1x/week   PT DURATION: 10 weeks   PLANNED INTERVENTIONS: Therapeutic exercises, Therapeutic activity, Neuromuscular re-education, Balance training, Gait training, Patient/Family education, Self Care, Joint mobilization, Spinal mobilization, Moist heat, Taping, and Manual therapy, dry needling.   PLAN FOR NEXT SESSION: See clinical impression for plan     GOALS: Goals reviewed with patient? Yes  SHORT TERM GOALS: Target date: 11/22/2023      Pt will demo IND with HEP                    Baseline: Not IND            Goal status: INITIAL   LONG TERM GOALS: Target date: 03/03/2024      1.Pt will demo proper deep core coordination without chest breathing and optimal excursion of diaphragm/pelvic floor in order to  promote spinal stability and pelvic floor function  Baseline: dyscoordination Goal status: MET   2.  Pt will demo > 5 pt change on FOTO  to improve QOL and function  PFDI Urinary baseline -  Lower score = better function  Urinary Problem baseline-   Higher score = better function  Pelvic Pain baseline - Lower score = better function  Bowel  constipation baseline -   Higher score = better function  PFDI Bowel - Higher score = better function  Lumber baseline  -  Higher score = better function   Goal status: referred   3.  Pt will demo proper body mechanics in against gravity tasks and ADLs  work tasks, fitness  to minimize straining pelvic floor / back    Baseline: not IND, improper form that places strain on pelvic floor  Goal status:  on going     4. Pt will demo increased gait speed > 1.3 m/s with reciprocal gait pattern, longer stride length  in order to ambulate safely in community and return to fitness routine  Baseline: 1.32 m/s, excessive sway to R  Goal status: MET ( Visit 10: 12/24/23:  1.4 2 m/s ,  reciprocal gait)    5. Pt will practice sleeping on back to minimize misalignment of spine and pelvis and minimize LBP/ hip pain , sleeping side with pillow between knees and also report 1 week of waking in the morning with < 50% occurrence of the pain at both areas   Baseline: sleep on the R side and on belly   Goal status:  MET ( Visit 10: 12/24/23: LBP decreased 4/10 to 2/10,  Plantar fasciitis 8/10 to 5/10 upon waking)    6. Pt will report rectocole occurs outside the vaginal < 3 days out of 7 days and no bleeding in order to improve QOL  Baseline:  Across 4 days out of 7 days, it worsens to the point it rubs outside the vaginal and it bleeds.  Goal status: MET ( Visit 10: 12/24/23:  no more bleeding across past 2 weeks, rectocele outside the vaginal occurs 2 days out of the week)    7. Pt will report no more straining with bowel movements across 100% of the time   Baseline: Pt has daily bowel movements and has to strain about 50% of time.  Goal status:  MET ( visit 10 )   8.  Pt will demo levelled pelvic girdle and shoulder height in order to progress to deep core strengthening HEP and restore mobility at spine, pelvis, gait, posture minimize falls, and improve balance  Baseline: R shoulder, iliac rest lowered Goal:  MET ( Visit 2: levelled)   9.   pt reported not feeling prolapse w./  correct form of  minisquat-heelpress exercise across 30 reps in order to contiue improving LKC for pickleball playing jumping/ pylometric in the games for 2-4 hours  Baseline:  pt reported feeling prolapse w./  correct form of  minisquat-heelpress exercise  Goal Status: INITIAL   Mariane Masters, PT 12/24/2023, 8:19 AM

## 2023-12-27 ENCOUNTER — Encounter: Payer: BC Managed Care – PPO | Admitting: Physical Therapy

## 2024-01-03 ENCOUNTER — Encounter: Payer: BC Managed Care – PPO | Admitting: Physical Therapy

## 2024-01-03 ENCOUNTER — Ambulatory Visit: Payer: 59 | Admitting: Physical Therapy

## 2024-01-10 ENCOUNTER — Ambulatory Visit: Payer: 59 | Attending: Obstetrics and Gynecology | Admitting: Physical Therapy

## 2024-01-10 DIAGNOSIS — R278 Other lack of coordination: Secondary | ICD-10-CM | POA: Diagnosis present

## 2024-01-10 DIAGNOSIS — M533 Sacrococcygeal disorders, not elsewhere classified: Secondary | ICD-10-CM | POA: Insufficient documentation

## 2024-01-10 DIAGNOSIS — R2689 Other abnormalities of gait and mobility: Secondary | ICD-10-CM | POA: Insufficient documentation

## 2024-01-10 NOTE — Therapy (Signed)
 OUTPATIENT PHYSICAL THERAPY  TREATMENT    Patient Name: Latoya Berry MRN: [id number] DOB:[birth year], 58 y.o., female Today's Date: 01/10/2024   PT End of Session - 01/10/24 0854     Visit Number 11    Number of Visits 20    Date for PT Re-Evaluation 03/03/24    PT Start Time 0852    PT Stop Time 0930    PT Time Calculation (min) 38 min    Activity Tolerance Patient tolerated treatment well;No increased pain    Behavior During Therapy WFL for tasks assessed/performed             Past Medical History:  Diagnosis Date   Allergy    Asthma    GERD (gastroesophageal reflux disease)    IUD (intrauterine device) in place    Menstrual migraine    Psoriasis    Past Surgical History:  Procedure Laterality Date   NASAL SEPTUM SURGERY     Patient Active Problem List   Diagnosis Date Noted   Genetic testing 10/22/2023   Tennis elbow 12/11/2012   Multiple nevi 12/15/2011   Migraine, menstrual 11/24/2011   Asthma, mild persistent 11/24/2011   Allergic rhinitis 11/24/2011    PCP: Judie Grieve   REFERRING PROVIDER: Schermerhorn   REFERRING DIAG: N81.6 (ICD-10-CM) - Rectocele  Rationale for Evaluation and Treatment Rehabilitation  THERAPY DIAG:  Other abnormalities of gait and mobility   Other lack of coordination   Sacrococcygeal disorders, not elsewhere classified  ONSET DATE:   SUBJECTIVE:                                                                                                                                                                                           SUBJECTIVE STATEMENT TODAY: Pt reports the prolapse was out 80% of the time on her trip which included lots of sitting. Pt had to put the prolapse back inside every day and it stayed in most of the day. It would rub and it was not as bad it had  been when it is out.    SUBJECTIVE STATEMENT ON EVAL 10/25/23  1) rectocele:  Pt has saw her PCP who told her that surgery is best. Pt saw a urogyn MD  who did not think it will require surgery and to leave to be.  Sometimes, she feels a bulge she can not put back. She can not pinpoint what worsens it.  Across 4 days out of 7 days, it worsens to the point it rubs outside the vaginal and it bleeds and hemorrhoids will form. Pt has daily bowel movements and has to strain about 50% of time.  2) LBP radiating to back of thigh L /  L hip pain by the glut:   4/10 pain  worst when waking up the morning, sleep on the R side and on belly     4) B plantar fasciitis ( R worse than L) :  occurs in the morning, after playing pickleball if she does not wear proper shoes and not stretch properly.     PERTINENT HISTORY:  Plays Pickleball  Appendectomy , perineal tears with  dtr ( only child) who was suctioned out    PAIN:  Are you having pain? Yes , see above   PRECAUTIONS: None  WEIGHT BEARING RESTRICTIONS: No   FALLS:  Has patient fallen in last 6 months? No  LIVING ENVIRONMENT: Lives with:family  Lives in: two story home  Stairs:   OCCUPATION:  teacher   PLOF:    PATIENT GOALS:  Address rectocele and LBP, feet pain   OBJECTIVE:    Pelvic Floor Special Questions - 01/10/24 0941     Prolapse Posterior Wall   standing and hooklying: outside introitus with cue for coughing   Pelvic Floor Internal Exam pt consented verbally without contraindications    Exam Type Vaginal    Palpation tightness along 5 o'clock L 1-3 rd layer,  anterior mm tightness L > R, tenderness    Strength fair squeeze, definite lift   posterior dominant              [facility] Adult PT Treatment/Exercise - 01/10/24 1243       Therapeutic Activites    Other Therapeutic Activities explained mechancis for pelvic floor coordination , showed anatomy images      Neuro Re-ed    Neuro Re-ed Details  excessive cues for less posetrior mm activation and dyscoordination , cued for anteiror tilt of pelvic tilt, proper upward movement of anterior and posterior mm       Manual Therapy   Internal Pelvic Floor MWM/STM release tightness at areas noted in assessment                HOME EXERCISE PROGRAM: See pt instruction section    ASSESSMENT:  CLINICAL IMPRESSION:  Pt met 6/8 goals and progressing well towards remaining goals.   Improvements overall: She sensed the urge more to go to the bathroom for bowel movements. Pt can feel the rectocele bulging and she had to put it back/ With putting it back , she felt she actually needed to go to the bathroom and was able to go with nothing blocking it.  Pt is maintaining levelled pelvic and shoulder alignment and upright posture  Gradually improving feet mobility to minimize overactivity of pelvic floor . Pt practiced techniques of alignment when playing pickleball this past week Pt reports her prolapse has decreased by 50% since starting Pelvic PT when playing pickleball. She still feels it and  she does have to put it back.(8th visit)    No more bleeding across past 2 weeks, rectocele outside the vaginal occurs 2 days out of the week ( Visit 10 with focus on LKC, and not directly at pelvic floor yet)    Working on today:    Internal pelvic floor assessment and Tx applied to minimize tightness of pelvic floor mm and correct dyscoordination. Pt demo'd lowered bladder outide introitus when cued for coughing in standing position. Pt demo'd proper upward position of bladder in hooklying post Tx with proper coordinaiton of pelvic floor and deep core system  Progressed to deep core level 2today with cues for proper technique.  Plan to  add well rounded stretching routine. Create chart for all HEP for better compliance and understanding.   These progressions will promote optimize IAP system for improved pelvic floor function, trunk stability, gait, balance, stabilization with mobility tasks.  Plan to address pelvic floor issues  at upcoming sessions.   Pt benefits from skilled PT.     OBJECTIVE IMPAIRMENTS decreased activity tolerance, decreased coordination, decreased endurance, decreased mobility, difficulty walking, decreased ROM, decreased strength, decreased safety awareness, hypomobility, increased muscle spasms, impaired flexibility, improper body mechanics, postural dysfunction, and pain. scar restrictions   ACTIVITY LIMITATIONS  self-care,  sleep, home chores, work tasks    PARTICIPATION LIMITATIONS:  community, gym activities    PERSONAL FACTORS        are also affecting patient's functional outcome.    REHAB POTENTIAL: Good   CLINICAL DECISION MAKING: Evolving/moderate complexity   EVALUATION COMPLEXITY: Moderate    PATIENT EDUCATION:    Education details: Showed pt anatomy images. Explained muscles attachments/ connection, physiology of deep core system/ spinal- thoracic-pelvis-lower kinetic chain as they relate to pt's presentation, Sx, and past Hx. Explained what and how these areas of deficits need to be restored to balance and function    See Therapeutic activity / neuromuscular re-education section  Answered pt's questions.   Person educated: Patient Education method: Explanation, Demonstration, Tactile cues, Verbal cues, and Handouts Education comprehension: verbalized understanding, returned demonstration, verbal cues required, tactile cues required, and needs further education     PLAN: PT FREQUENCY: 1x/week   PT DURATION: 10 weeks   PLANNED INTERVENTIONS: Therapeutic exercises, Therapeutic activity, Neuromuscular re-education, Balance training, Gait training, Patient/Family education, Self Care, Joint mobilization, Spinal mobilization, Moist heat, Taping, and Manual therapy, dry needling.   PLAN FOR NEXT SESSION: See clinical impression for plan     GOALS: Goals reviewed with patient? Yes  SHORT TERM GOALS: Target date: 11/22/2023      Pt will demo IND with HEP                    Baseline: Not IND            Goal status:  INITIAL   LONG TERM GOALS: Target date: 03/03/2024      1.Pt will demo proper deep core coordination without chest breathing and optimal excursion of diaphragm/pelvic floor in order to promote spinal stability and pelvic floor function  Baseline: dyscoordination Goal status: MET   2.  Pt will demo > 5 pt change on FOTO  to improve QOL and function  PFDI Urinary baseline -  Lower score = better function  Urinary Problem baseline-   Higher score = better function  Pelvic Pain baseline - Lower score = better function  Bowel  constipation baseline -   Higher score = better function  PFDI Bowel - Higher score = better function  Lumber baseline  -  Higher score = better function   Goal status: referred   3.  Pt will demo proper body mechanics in against gravity tasks and ADLs  work tasks, fitness  to minimize straining pelvic floor / back    Baseline: not IND, improper form that places strain on pelvic floor  Goal status:  on going     4. Pt will demo increased gait speed > 1.3 m/s with reciprocal gait pattern, longer stride length  in order to ambulate safely in community and return to fitness routine  Baseline: 1.32 m/s, excessive sway to R  Goal status: MET ( Visit 10: 12/24/23:  1.4 2 m/s , reciprocal gait)    5. Pt will practice sleeping on back to minimize misalignment of spine and pelvis and minimize LBP/ hip pain , sleeping side with pillow between knees and also report 1 week of waking in the morning with < 50% occurrence of the pain at both areas   Baseline: sleep on the R side and on belly   Goal status:  MET ( Visit 10: 12/24/23: LBP decreased 4/10 to 2/10,  Plantar fasciitis 8/10 to 5/10 upon waking)    6. Pt will report rectocole occurs outside the vaginal < 3 days out of 7 days and no bleeding in order to improve QOL  Baseline:  Across 4 days out of 7 days, it worsens to the point it rubs outside the vaginal and it bleeds.  Goal status: MET ( Visit 10:  12/24/23:  no more bleeding across past 2 weeks, rectocele outside the vaginal occurs 2 days out of the week)    7. Pt will report no more straining with bowel movements across 100% of the time  Baseline: Pt has daily bowel movements and has to strain about 50% of time.  Goal status:  MET ( visit 10 )   8.  Pt will demo levelled pelvic girdle and shoulder height in order to progress to deep core strengthening HEP and restore mobility at spine, pelvis, gait, posture minimize falls, and improve balance  Baseline: R shoulder, iliac rest lowered Goal:  MET ( Visit 2: levelled)   9.   pt reported not feeling prolapse w./  correct form of  minisquat-heelpress exercise across 30 reps in order to contiue improving LKC for pickleball playing jumping/ pylometric in the games for 2-4 hours  Baseline:  pt reported feeling prolapse w./  correct form of  minisquat-heelpress exercise  Goal Status: INITIAL   Mariane Masters, PT 01/10/2024, 8:55 AM

## 2024-01-17 ENCOUNTER — Ambulatory Visit: Payer: 59 | Admitting: Physical Therapy

## 2024-01-17 DIAGNOSIS — M533 Sacrococcygeal disorders, not elsewhere classified: Secondary | ICD-10-CM | POA: Diagnosis not present

## 2024-01-17 DIAGNOSIS — R2689 Other abnormalities of gait and mobility: Secondary | ICD-10-CM

## 2024-01-17 DIAGNOSIS — R278 Other lack of coordination: Secondary | ICD-10-CM

## 2024-01-17 NOTE — Patient Instructions (Signed)
  Laying on your back  Morning and night    On your back, slight anterior tilt of pelvis, feet firm, knees bent, Feet hip width apart    Mon Tue Wed Thurs Fri  Sat Sun           quick  Pelvic floor squeeze  5 reps           Deep core level 1 ( 10 breaths)   quick  Pelvic floor squeeze  5 reps            Deep core level 2 ( 6 min)   quick  Pelvic floor squeeze  5 reps

## 2024-01-17 NOTE — Therapy (Signed)
 OUTPATIENT PHYSICAL THERAPY  TREATMENT    Patient Name: Latoya Berry MRN: [id number] DOB:Aug 19, 1966, 58 y.o., female Today's Date: 01/17/2024   PT End of Session - 01/17/24 1109     Visit Number 12    Number of Visits 20    Date for PT Re-Evaluation 03/03/24    PT Start Time 1105    PT Stop Time 1145    PT Time Calculation (min) 40 min    Activity Tolerance Patient tolerated treatment well;No increased pain    Behavior During Therapy WFL for tasks assessed/performed             Past Medical History:  Diagnosis Date   Allergy    Asthma    GERD (gastroesophageal reflux disease)    IUD (intrauterine device) in place    Menstrual migraine    Psoriasis    Past Surgical History:  Procedure Laterality Date   NASAL SEPTUM SURGERY     Patient Active Problem List   Diagnosis Date Noted   Genetic testing 10/22/2023   Tennis elbow 12/11/2012   Multiple nevi 12/15/2011   Migraine, menstrual 11/24/2011   Asthma, mild persistent 11/24/2011   Allergic rhinitis 11/24/2011    PCP: Judie Grieve   REFERRING PROVIDER: Schermerhorn   REFERRING DIAG: N81.6 (ICD-10-CM) - Rectocele  Rationale for Evaluation and Treatment Rehabilitation  THERAPY DIAG:  Other abnormalities of gait and mobility   Other lack of coordination   Sacrococcygeal disorders, not elsewhere classified  ONSET DATE:   SUBJECTIVE:                                                                                                                                                                                           SUBJECTIVE STATEMENT TODAY: Pt reports after last session, the prolapse was out 60% of the time instead of 80% of the time. The prolapse was in place for 3 days before she had to put it back up and in. Pt played pickleball  the past week without having to put it back in.  The only times when she feels she has to put back up now is after long hours of sitting.   SUBJECTIVE STATEMENT ON EVAL  10/25/23  1) rectocele:  Pt has saw her PCP who told her that surgery is best. Pt saw a urogyn MD who did not think it will require surgery and to leave to be.  Sometimes, she feels a bulge she can not put back. She can not pinpoint what worsens it.  Across 4 days out of 7 days, it worsens to the point it rubs outside the vaginal and it bleeds  and hemorrhoids will form. Pt has daily bowel movements and has to strain about 50% of time.    2) LBP radiating to back of thigh L /  L hip pain by the glut:   4/10 pain  worst when waking up the morning, sleep on the R side and on belly     4) B plantar fasciitis ( R worse than L) :  occurs in the morning, after playing pickleball if she does not wear proper shoes and not stretch properly.     PERTINENT HISTORY:  Plays Pickleball  Appendectomy , perineal tears with  dtr ( only child) who was suctioned out    PAIN:  Are you having pain? Yes , see above   PRECAUTIONS: None  WEIGHT BEARING RESTRICTIONS: No   FALLS:  Has patient fallen in last 6 months? No  LIVING ENVIRONMENT: Lives with:family  Lives in: two story home  Stairs:   OCCUPATION:  teacher   PLOF:    PATIENT GOALS:  Address rectocele and LBP, feet pain   OBJECTIVE:    Pelvic Floor Special Questions - 01/17/24 1148     Pelvic Floor Internal Exam pt consented verbally without contraindications    Exam Type Vaginal    Palpation tightness along anterior mm , lowered urethra    Strength fair squeeze, definite lift   more circular. sequential post training today and post Tx              [facility] Adult PT Treatment/Exercise - 01/17/24 1148       Neuro Re-ed    Neuro Re-ed Details  exessive cues for more optimal diaphragm excursion for pelvic floor coordination      Manual Therapy   Manual therapy comments STM/MWM at mm atttachments along pubic symphysis B    Internal Pelvic Floor STM/MWM at anterior mm to promote upward position of urethra and bladder position with  training               HOME EXERCISE PROGRAM: See pt instruction section    ASSESSMENT:  CLINICAL IMPRESSION:  Pt met 6/8 goals and progressing well towards remaining goals.   Improvements overall: She sensed the urge more to go to the bathroom for bowel movements. Pt can feel the rectocele bulging and she had to put it back/ With putting it back , she felt she actually needed to go to the bathroom and was able to go with nothing blocking it.  Pt is maintaining levelled pelvic and shoulder alignment and upright posture  Gradually improving feet mobility to minimize overactivity of pelvic floor . Pt practiced techniques of alignment when playing pickleball this past week Pt reports her prolapse has decreased by 50% since starting Pelvic PT when playing pickleball. She still feels it and  she does have to put it back.(8th visit)    No more bleeding across past 2 weeks, rectocele outside the vaginal occurs 2 days out of the week ( Visit 10 with focus on LKC, and not directly at pelvic floor yet)  Pt reports after last session, the prolapse was out 60% of the time instead of 80% of the time. The prolapse was in place for 3 days before she had to put it back up and in. Pt played pickleball  the past week without having to put it back in.  The only times when she feels she has to put back up now is after long hours of sitting.   Working on today:  Continued with Internal pelvic floor Tx d to minimize tightness of pelvic floor mm and correct dyscoordination.                          Progressed to quick kegel contractions today after excessive cues for proper coordination. Pt demo;d more upward position of urethra and bladder post Tx.                          Plan to  add well rounded stretching routine and advance to long contractions of kegel contractions next session  These progressions will promote optimize IAP system for improved pelvic floor function, trunk stability, gait,  balance, stabilization with mobility tasks.  Plan to address pelvic floor issues  at upcoming sessions.   Pt benefits from skilled PT.    OBJECTIVE IMPAIRMENTS decreased activity tolerance, decreased coordination, decreased endurance, decreased mobility, difficulty walking, decreased ROM, decreased strength, decreased safety awareness, hypomobility, increased muscle spasms, impaired flexibility, improper body mechanics, postural dysfunction, and pain. scar restrictions   ACTIVITY LIMITATIONS  self-care,  sleep, home chores, work tasks    PARTICIPATION LIMITATIONS:  community, gym activities    PERSONAL FACTORS        are also affecting patient's functional outcome.    REHAB POTENTIAL: Good   CLINICAL DECISION MAKING: Evolving/moderate complexity   EVALUATION COMPLEXITY: Moderate    PATIENT EDUCATION:    Education details: Showed pt anatomy images. Explained muscles attachments/ connection, physiology of deep core system/ spinal- thoracic-pelvis-lower kinetic chain as they relate to pt's presentation, Sx, and past Hx. Explained what and how these areas of deficits need to be restored to balance and function    See Therapeutic activity / neuromuscular re-education section  Answered pt's questions.   Person educated: Patient Education method: Explanation, Demonstration, Tactile cues, Verbal cues, and Handouts Education comprehension: verbalized understanding, returned demonstration, verbal cues required, tactile cues required, and needs further education     PLAN: PT FREQUENCY: 1x/week   PT DURATION: 10 weeks   PLANNED INTERVENTIONS: Therapeutic exercises, Therapeutic activity, Neuromuscular re-education, Balance training, Gait training, Patient/Family education, Self Care, Joint mobilization, Spinal mobilization, Moist heat, Taping, and Manual therapy, dry needling.   PLAN FOR NEXT SESSION: See clinical impression for plan     GOALS: Goals reviewed with patient?  Yes  SHORT TERM GOALS: Target date: 11/22/2023      Pt will demo IND with HEP                    Baseline: Not IND            Goal status: INITIAL   LONG TERM GOALS: Target date: 03/03/2024      1.Pt will demo proper deep core coordination without chest breathing and optimal excursion of diaphragm/pelvic floor in order to promote spinal stability and pelvic floor function  Baseline: dyscoordination Goal status: MET   2.  Pt will demo > 5 pt change on FOTO  to improve QOL and function  PFDI Urinary baseline -  Lower score = better function  Urinary Problem baseline-   Higher score = better function  Pelvic Pain baseline - Lower score = better function  Bowel  constipation baseline -   Higher score = better function  PFDI Bowel - Higher score = better function  Lumber baseline  -  Higher score = better function   Goal status: referred   3.  Pt  will demo proper body mechanics in against gravity tasks and ADLs  work tasks, fitness  to minimize straining pelvic floor / back    Baseline: not IND, improper form that places strain on pelvic floor  Goal status:  on going     4. Pt will demo increased gait speed > 1.3 m/s with reciprocal gait pattern, longer stride length  in order to ambulate safely in community and return to fitness routine  Baseline: 1.32 m/s, excessive sway to R  Goal status: MET ( Visit 10: 12/24/23:  1.4 2 m/s , reciprocal gait)    5. Pt will practice sleeping on back to minimize misalignment of spine and pelvis and minimize LBP/ hip pain , sleeping side with pillow between knees and also report 1 week of waking in the morning with < 50% occurrence of the pain at both areas   Baseline: sleep on the R side and on belly   Goal status:  MET ( Visit 10: 12/24/23: LBP decreased 4/10 to 2/10,  Plantar fasciitis 8/10 to 5/10 upon waking)    6. Pt will report rectocole occurs outside the vaginal < 3 days out of 7 days and no bleeding in order to improve  QOL  Baseline:  Across 4 days out of 7 days, it worsens to the point it rubs outside the vaginal and it bleeds.  Goal status: MET ( Visit 10: 12/24/23:  no more bleeding across past 2 weeks, rectocele outside the vaginal occurs 2 days out of the week)    7. Pt will report no more straining with bowel movements across 100% of the time  Baseline: Pt has daily bowel movements and has to strain about 50% of time.  Goal status:  MET ( visit 10 )   8.  Pt will demo levelled pelvic girdle and shoulder height in order to progress to deep core strengthening HEP and restore mobility at spine, pelvis, gait, posture minimize falls, and improve balance  Baseline: R shoulder, iliac rest lowered Goal:  MET ( Visit 2: levelled)   9.   pt reported not feeling prolapse w./  correct form of  minisquat-heelpress exercise across 30 reps in order to contiue improving LKC for pickleball playing jumping/ pylometric in the games for 2-4 hours  Baseline:  pt reported feeling prolapse w./  correct form of  minisquat-heelpress exercise  Goal Status: INITIAL   Mariane Masters, PT 01/17/2024, 11:09 AM

## 2024-01-22 ENCOUNTER — Telehealth: Payer: Self-pay | Admitting: *Deleted

## 2024-01-22 NOTE — Telephone Encounter (Signed)
 I told her that she will need to go through and I gave her the billing number [phone]

## 2024-01-24 ENCOUNTER — Encounter: Payer: 59 | Admitting: Physical Therapy

## 2024-01-31 ENCOUNTER — Ambulatory Visit: Payer: 59 | Admitting: Physical Therapy

## 2024-01-31 DIAGNOSIS — R2689 Other abnormalities of gait and mobility: Secondary | ICD-10-CM

## 2024-01-31 DIAGNOSIS — M533 Sacrococcygeal disorders, not elsewhere classified: Secondary | ICD-10-CM | POA: Diagnosis not present

## 2024-01-31 DIAGNOSIS — R278 Other lack of coordination: Secondary | ICD-10-CM

## 2024-01-31 NOTE — Patient Instructions (Signed)
   Lengthen Back rib by R  shoulder   ( winging)    Lie on L  side , pillow between knees and under head  Pull  R arm overhead over mattress, grab the edge of mattress,pull it upward, drawing elbow away from ears  Breathing 10 reps  Brushing arm with 3/4 turn onto pillow behind back  Lying on L  side ,Pillow/ Block between knees     dragging top forearm across ribs below breast rotating 3/4 turn,  rotating  _R_ only this week ,  relax onto the pillow behind the back  and then back to other palm , maintain top palm on body whole top and not lift shoulder  Do this side this week       Wait do both sides until we have levelled out your spine and shoulders  __________________  Deep core level 1  10 breaths  Deep core level 2 ( 6 min)   No squeezes Notice the exhale, subtle lift circumferential and sequential ( telescope cup)

## 2024-01-31 NOTE — Therapy (Signed)
 OUTPATIENT PHYSICAL THERAPY  TREATMENT    Patient Name: Latoya Berry MRN: [id number] DOB:[birth year], 58 y.o., female Today's Date: 01/31/2024   PT End of Session - 01/31/24 1105     Visit Number 13    Number of Visits 20    Date for PT Re-Evaluation 03/03/24    PT Start Time 1103    PT Stop Time 1145    PT Time Calculation (min) 42 min    Activity Tolerance Patient tolerated treatment well;No increased pain    Behavior During Therapy WFL for tasks assessed/performed             Past Medical History:  Diagnosis Date   Allergy    Asthma    GERD (gastroesophageal reflux disease)    IUD (intrauterine device) in place    Menstrual migraine    Psoriasis    Past Surgical History:  Procedure Laterality Date   NASAL SEPTUM SURGERY     Patient Active Problem List   Diagnosis Date Noted   Genetic testing 10/22/2023   Tennis elbow 12/11/2012   Multiple nevi 12/15/2011   Migraine, menstrual 11/24/2011   Asthma, mild persistent 11/24/2011   Allergic rhinitis 11/24/2011    PCP: Judie Grieve   REFERRING PROVIDER: Schermerhorn   REFERRING DIAG: N81.6 (ICD-10-CM) - Rectocele  Rationale for Evaluation and Treatment Rehabilitation  THERAPY DIAG:  Other abnormalities of gait and mobility   Other lack of coordination   Sacrococcygeal disorders, not elsewhere classified  ONSET DATE:   SUBJECTIVE:                                                                                                                                                                                           SUBJECTIVE STATEMENT TODAY: Pt reports R shoulder  blade  pain today and she thinks she may have slept wrong,   Pt has noticed the prolapse stayed in place for 7-8 days. Pt noticed the prolapse lowered when she is stressed   SUBJECTIVE STATEMENT ON EVAL 10/25/23  1) rectocele:  Pt has saw her PCP who told her that surgery is best. Pt saw a urogyn MD who did not think it will require surgery  and to leave to be.  Sometimes, she feels a bulge she can not put back. She can not pinpoint what worsens it.  Across 4 days out of 7 days, it worsens to the point it rubs outside the vaginal and it bleeds and hemorrhoids will form. Pt has daily bowel movements and has to strain about 50% of time.    2) LBP radiating to back of thigh L /  L hip  pain by the glut:   4/10 pain  worst when waking up the morning, sleep on the R side and on belly     4) B plantar fasciitis ( R worse than L) :  occurs in the morning, after playing pickleball if she does not wear proper shoes and not stretch properly.     PERTINENT HISTORY:  Plays Pickleball  Appendectomy , perineal tears with  dtr ( only child) who was suctioned out    PAIN:  Are you having pain? Yes , see above   PRECAUTIONS: None  WEIGHT BEARING RESTRICTIONS: No   FALLS:  Has patient fallen in last 6 months? No  LIVING ENVIRONMENT: Lives with:family  Lives in: two story home  Stairs:   OCCUPATION:  teacher   PLOF:    PATIENT GOALS:  Address rectocele and LBP, feet pain   OBJECTIVE:     [facility] PT Assessment - 01/31/24 1226       Palpation   Palpation comment tightness along thoracic segment R             [facility] Adult PT Treatment/Exercise - 01/31/24 1223       Self-Care   Other Self-Care Comments  biopsychosocial approaches to reinforce relaxatin practices, subtle movement of pelvic floor for proper lift and minimizing prolapse      Neuro Re-ed    Neuro Re-ed Details  cued for for circumferential and sequential coordination of vaginal wall instead of overuse of posterior rectal activation, cued for thoracic /s capular stretches      Manual Therapy   Internal Pelvic Floor tactile biofeedback propcioeption               HOME EXERCISE PROGRAM: See pt instruction section    ASSESSMENT:  CLINICAL IMPRESSION:   Improvements overall: She sensed the urge more to go to the bathroom for bowel movements. Pt  can feel the rectocele bulging and she had to put it back/ With putting it back , she felt she actually needed to go to the bathroom and was able to go with nothing blocking it.  Pt is maintaining levelled pelvic and shoulder alignment and upright posture  Gradually improving feet mobility to minimize overactivity of pelvic floor . Pt practiced techniques of alignment when playing pickleball this past week Pt reports her prolapse has decreased by 50% since starting Pelvic PT when playing pickleball. She still feels it and  she does have to put it back.(8th visit)    No more bleeding across past 2 weeks, rectocele outside the vaginal occurs 2 days out of the week ( Visit 10 with focus on LKC, and not directly at pelvic floor yet)  Pt reports after last session, Pt has noticed the prolapse stayed in place for 7-8 days. Pt noticed the prolapse lowered when she is stressed .   Working on today:   Continued with propioceptive training with internal pelvic floor biofeedback with therapist's index digit.  Cued for  circumferential and sequential coordination of vaginal wall instead of overuse of posterior rectal activation. Withholding kegel contractions due to poor coordination  Addressed tight R scapular mm tightness with manual Tx and HEP as pt is playing pickball tournaments.                                       Plan to  add well rounded stretching routine and advance to quick /  long contractions of kegel contractions next session  These progressions will promote optimize IAP system for improved pelvic floor function, trunk stability, gait, balance, stabilization with mobility tasks.  Plan to address pelvic floor issues  at upcoming sessions.   Pt benefits from skilled PT.    OBJECTIVE IMPAIRMENTS decreased activity tolerance, decreased coordination, decreased endurance, decreased mobility, difficulty walking, decreased ROM, decreased strength, decreased safety awareness, hypomobility,  increased muscle spasms, impaired flexibility, improper body mechanics, postural dysfunction, and pain. scar restrictions   ACTIVITY LIMITATIONS  self-care,  sleep, home chores, work tasks    PARTICIPATION LIMITATIONS:  community, gym activities    PERSONAL FACTORS        are also affecting patient's functional outcome.    REHAB POTENTIAL: Good   CLINICAL DECISION MAKING: Evolving/moderate complexity   EVALUATION COMPLEXITY: Moderate    PATIENT EDUCATION:    Education details: Showed pt anatomy images. Explained muscles attachments/ connection, physiology of deep core system/ spinal- thoracic-pelvis-lower kinetic chain as they relate to pt's presentation, Sx, and past Hx. Explained what and how these areas of deficits need to be restored to balance and function    See Therapeutic activity / neuromuscular re-education section  Answered pt's questions.   Person educated: Patient Education method: Explanation, Demonstration, Tactile cues, Verbal cues, and Handouts Education comprehension: verbalized understanding, returned demonstration, verbal cues required, tactile cues required, and needs further education     PLAN: PT FREQUENCY: 1x/week   PT DURATION: 10 weeks   PLANNED INTERVENTIONS: Therapeutic exercises, Therapeutic activity, Neuromuscular re-education, Balance training, Gait training, Patient/Family education, Self Care, Joint mobilization, Spinal mobilization, Moist heat, Taping, and Manual therapy, dry needling.   PLAN FOR NEXT SESSION: See clinical impression for plan     GOALS: Goals reviewed with patient? Yes  SHORT TERM GOALS: Target date: 11/22/2023      Pt will demo IND with HEP                    Baseline: Not IND            Goal status: INITIAL   LONG TERM GOALS: Target date: 03/03/2024      1.Pt will demo proper deep core coordination without chest breathing and optimal excursion of diaphragm/pelvic floor in order to promote spinal stability  and pelvic floor function  Baseline: dyscoordination Goal status: MET   2.  Pt will demo > 5 pt change on FOTO  to improve QOL and function  PFDI Urinary baseline -  Lower score = better function  Urinary Problem baseline-   Higher score = better function  Pelvic Pain baseline - Lower score = better function  Bowel  constipation baseline -   Higher score = better function  PFDI Bowel - Higher score = better function  Lumber baseline  -  Higher score = better function   Goal status: referred   3.  Pt will demo proper body mechanics in against gravity tasks and ADLs  work tasks, fitness  to minimize straining pelvic floor / back    Baseline: not IND, improper form that places strain on pelvic floor  Goal status:  on going     4. Pt will demo increased gait speed > 1.3 m/s with reciprocal gait pattern, longer stride length  in order to ambulate safely in community and return to fitness routine  Baseline: 1.32 m/s, excessive sway to R  Goal status: MET ( Visit 10: 12/24/23:  1.4 2 m/s , reciprocal gait)  5. Pt will practice sleeping on back to minimize misalignment of spine and pelvis and minimize LBP/ hip pain , sleeping side with pillow between knees and also report 1 week of waking in the morning with < 50% occurrence of the pain at both areas   Baseline: sleep on the R side and on belly   Goal status:  MET ( Visit 10: 12/24/23: LBP decreased 4/10 to 2/10,  Plantar fasciitis 8/10 to 5/10 upon waking)    6. Pt will report rectocole occurs outside the vaginal < 3 days out of 7 days and no bleeding in order to improve QOL  Baseline:  Across 4 days out of 7 days, it worsens to the point it rubs outside the vaginal and it bleeds.  Goal status: MET ( Visit 10: 12/24/23:  no more bleeding across past 2 weeks, rectocele outside the vaginal occurs 2 days out of the week)    7. Pt will report no more straining with bowel movements across 100% of the time  Baseline: Pt has daily  bowel movements and has to strain about 50% of time.  Goal status:  MET ( visit 10 )   8.  Pt will demo levelled pelvic girdle and shoulder height in order to progress to deep core strengthening HEP and restore mobility at spine, pelvis, gait, posture minimize falls, and improve balance  Baseline: R shoulder, iliac rest lowered Goal:  MET ( Visit 2: levelled)   9.   pt reported not feeling prolapse w./  correct form of  minisquat-heelpress exercise across 30 reps in order to contiue improving LKC for pickleball playing jumping/ pylometric in the games for 2-4 hours  Baseline:  pt reported feeling prolapse w./  correct form of  minisquat-heelpress exercise  Goal Status: INITIAL   Mariane Masters, PT 01/31/2024, 11:05 AM

## 2024-02-06 ENCOUNTER — Ambulatory Visit: Admitting: Physical Therapy

## 2024-02-07 ENCOUNTER — Ambulatory Visit: Payer: 59 | Admitting: Physical Therapy

## 2024-02-14 ENCOUNTER — Ambulatory Visit: Payer: 59 | Admitting: Physical Therapy

## 2024-03-05 ENCOUNTER — Ambulatory Visit: Attending: Obstetrics and Gynecology | Admitting: Physical Therapy

## 2024-03-05 DIAGNOSIS — R278 Other lack of coordination: Secondary | ICD-10-CM | POA: Insufficient documentation

## 2024-03-05 DIAGNOSIS — M533 Sacrococcygeal disorders, not elsewhere classified: Secondary | ICD-10-CM | POA: Insufficient documentation

## 2024-03-05 DIAGNOSIS — R2689 Other abnormalities of gait and mobility: Secondary | ICD-10-CM | POA: Insufficient documentation

## 2024-03-05 NOTE — Therapy (Addendum)
 OUTPATIENT PHYSICAL THERAPY  Discharge Summary across 13 visits    Patient Name: Latoya Berry MRN: [id number] DOB:1966-01-13, 58 y.o., female Today's Date: 03/05/2024   Past Medical History:  Diagnosis Date   Allergy    Asthma    GERD (gastroesophageal reflux disease)    IUD (intrauterine device) in place    Menstrual migraine    Psoriasis    Past Surgical History:  Procedure Laterality Date   NASAL SEPTUM SURGERY     Patient Active Problem List   Diagnosis Date Noted   Genetic testing 10/22/2023   Tennis elbow 12/11/2012   Multiple nevi 12/15/2011   Migraine, menstrual 11/24/2011   Asthma, mild persistent 11/24/2011   Allergic rhinitis 11/24/2011    PCP: Geraldean Klein   REFERRING PROVIDER: Schermerhorn   REFERRING DIAG: N81.6 (ICD-10-CM) - Rectocele  Rationale for Evaluation and Treatment Rehabilitation  THERAPY DIAG:  Other abnormalities of gait and mobility   Other lack of coordination   Sacrococcygeal disorders, not elsewhere classified  ONSET DATE:     ASSESSMENT:  CLINICAL IMPRESSION:   Improvements overall: She sensed the urge more to go to the bathroom for bowel movements. Pt can feel the rectocele bulging and she had to put it back/ With putting it back , she felt she actually needed to go to the bathroom and was able to go with nothing blocking it.  Pt is maintaining levelled pelvic and shoulder alignment and upright posture  Gradually improving feet mobility to minimize overactivity of pelvic floor . Pt practiced techniques of alignment when playing pickleball this past week Pt reports her prolapse has decreased by 50% since starting Pelvic PT when playing pickleball. She still feels it and  she does have to put it back.(8th visit)    No more bleeding across past 2 weeks, rectocele outside the vaginal occurs 2 days out of the week ( Visit 10 with focus on LKC, and not directly at pelvic floor yet) Pt only had to put prolapse up with her finger 4  x across month compared to daily.  Pt has maintained corrected alignment of spine, pelvis, upright posture, and LKC mobility.  Pt voiced understanding how diet helped her bowel movements and plans to manitain better diet with fiber , Metamucil.    Pt met  100% of her goals and is ready to d/c at this time.                OBJECTIVE IMPAIRMENTS decreased activity tolerance, decreased coordination, decreased endurance, decreased mobility, difficulty walking, decreased ROM, decreased strength, decreased safety awareness, hypomobility, increased muscle spasms, impaired flexibility, improper body mechanics, postural dysfunction, and pain. scar restrictions   ACTIVITY LIMITATIONS  self-care,  sleep, home chores, work tasks    PARTICIPATION LIMITATIONS:  community, gym activities    PERSONAL FACTORS        are also affecting patient's functional outcome.    REHAB POTENTIAL: Good   CLINICAL DECISION MAKING: Evolving/moderate complexity   EVALUATION COMPLEXITY: Moderate    PATIENT EDUCATION:    Education details: Showed pt anatomy images. Explained muscles attachments/ connection, physiology of deep core system/ spinal- thoracic-pelvis-lower kinetic chain as they relate to pt's presentation, Sx, and past Hx. Explained what and how these areas of deficits need to be restored to balance and function    See Therapeutic activity / neuromuscular re-education section  Answered pt's questions.   Person educated: Patient Education method: Explanation, Demonstration, Tactile cues, Verbal cues, and Handouts Education comprehension: verbalized understanding, returned  demonstration, verbal cues required, tactile cues required, and needs further education     PLAN: PT FREQUENCY: 1x/week   PT DURATION: 10 weeks   PLANNED INTERVENTIONS: Therapeutic exercises, Therapeutic activity, Neuromuscular re-education, Balance training, Gait training, Patient/Family education, Self Care, Joint mobilization,  Spinal mobilization, Moist heat, Taping, and Manual therapy, dry needling.   PLAN FOR NEXT SESSION: See clinical impression for plan     GOALS: Goals reviewed with patient? Yes  SHORT TERM GOALS: Target date: 11/22/2023      Pt will demo IND with HEP                    Baseline: Not IND            Goal status: INITIAL   LONG TERM GOALS: Target date: 03/03/2024      1.Pt will demo proper deep core coordination without chest breathing and optimal excursion of diaphragm/pelvic floor in order to promote spinal stability and pelvic floor function  Baseline: dyscoordination Goal status: MET   2.  Pt will demo > 5 pt change on FOTO  to improve QOL and function  PFDI Urinary baseline -  Lower score = better function  Urinary Problem baseline-   Higher score = better function  Pelvic Pain baseline - Lower score = better function  Bowel  constipation baseline -   Higher score = better function  PFDI Bowel - Higher score = better function  Lumber baseline  -  Higher score = better function   Goal status: referred   3.  Pt will demo proper body mechanics in against gravity tasks and ADLs  work tasks, fitness  to minimize straining pelvic floor / back    Baseline: not IND, improper form that places strain on pelvic floor  Goal status:  MET     4. Pt will demo increased gait speed > 1.3 m/s with reciprocal gait pattern, longer stride length  in order to ambulate safely in community and return to fitness routine  Baseline: 1.32 m/s, excessive sway to R  Goal status: MET ( Visit 10: 12/24/23:  1.4 2 m/s , reciprocal gait)    5. Pt will practice sleeping on back to minimize misalignment of spine and pelvis and minimize LBP/ hip pain , sleeping side with pillow between knees and also report 1 week of waking in the morning with < 50% occurrence of the pain at both areas   Baseline: sleep on the R side and on belly   Goal status:  MET ( Visit 10: 12/24/23: LBP decreased  4/10 to 2/10,  Plantar fasciitis 8/10 to 5/10 upon waking)    6. Pt will report rectocole occurs outside the vaginal < 3 days out of 7 days and no bleeding in order to improve QOL  Baseline:  Across 4 days out of 7 days, it worsens to the point it rubs outside the vaginal and it bleeds.  Goal status: MET ( Visit 10: 12/24/23:  no more bleeding across past 2 weeks, rectocele outside the vaginal occurs 2 days out of the week,  03/05/24 :  Pt only had to put prolapse up with her finger 4 x across month compared to daily. )    7. Pt will report no more straining with bowel movements across 100% of the time  Baseline: Pt has daily bowel movements and has to strain about 50% of time.  Goal status:  MET ( visit 10 )   8.  Pt will  demo levelled pelvic girdle and shoulder height in order to progress to deep core strengthening HEP and restore mobility at spine, pelvis, gait, posture minimize falls, and improve balance  Baseline: R shoulder, iliac rest lowered Goal:  MET ( Visit 2: levelled)   9.   pt reported not feeling prolapse w./  correct form of  minisquat-heelpress exercise across 30 reps in order to contiue improving LKC for pickleball playing jumping/ pylometric in the games for 2-4 hours  Baseline:  pt reported feeling prolapse w./  correct form of  minisquat-heelpress exercise  Goal Status: MET [place] Andreas, PT 03/05/2024, 3:39 PM

## 2024-07-22 ENCOUNTER — Other Ambulatory Visit: Payer: Self-pay | Admitting: Otolaryngology

## 2024-07-22 DIAGNOSIS — H9201 Otalgia, right ear: Secondary | ICD-10-CM

## 2024-07-22 DIAGNOSIS — H9311 Tinnitus, right ear: Secondary | ICD-10-CM

## 2024-08-06 ENCOUNTER — Encounter: Payer: Self-pay | Admitting: Otolaryngology

## 2024-08-16 ENCOUNTER — Ambulatory Visit
Admission: RE | Admit: 2024-08-16 | Discharge: 2024-08-16 | Disposition: A | Discharge status: Home / Self Care | Source: Ambulatory Visit | Attending: Otolaryngology | Admitting: Otolaryngology

## 2024-08-16 DIAGNOSIS — H9201 Otalgia, right ear: Secondary | ICD-10-CM

## 2024-08-16 DIAGNOSIS — H9311 Tinnitus, right ear: Secondary | ICD-10-CM

## 2024-08-16 MED ORDER — GADOPICLENOL 0.5 MMOL/ML IV SOLN
5.0000 mL | Freq: Once | INTRAVENOUS | Status: AC | PRN
  Administered 2024-08-16: 5 mL via INTRAVENOUS
